# Patient Record
Sex: Female | Born: 1980 | Race: White | Hispanic: No | Marital: Married | Smoking: Never smoker
Health system: Southern US, Community
[De-identification: ages and names within clinical notes are randomized; demographics above are authoritative.]

## PROBLEM LIST (undated history)

## (undated) DIAGNOSIS — F32A Depression, unspecified: Secondary | ICD-10-CM

## (undated) DIAGNOSIS — O99345 Other mental disorders complicating the puerperium: Secondary | ICD-10-CM

## (undated) DIAGNOSIS — F22 Delusional disorders: Secondary | ICD-10-CM

## (undated) DIAGNOSIS — F419 Anxiety disorder, unspecified: Secondary | ICD-10-CM

## (undated) DIAGNOSIS — F53 Postpartum depression: Secondary | ICD-10-CM

## (undated) DIAGNOSIS — K9041 Non-celiac gluten sensitivity: Secondary | ICD-10-CM

## (undated) DIAGNOSIS — Z8619 Personal history of other infectious and parasitic diseases: Secondary | ICD-10-CM

## (undated) DIAGNOSIS — N200 Calculus of kidney: Secondary | ICD-10-CM

## (undated) DIAGNOSIS — M1990 Unspecified osteoarthritis, unspecified site: Secondary | ICD-10-CM

## (undated) DIAGNOSIS — L4050 Arthropathic psoriasis, unspecified: Secondary | ICD-10-CM

## (undated) DIAGNOSIS — D649 Anemia, unspecified: Secondary | ICD-10-CM

## (undated) DIAGNOSIS — F329 Major depressive disorder, single episode, unspecified: Secondary | ICD-10-CM

## (undated) HISTORY — DX: Non-celiac gluten sensitivity: K90.41

## (undated) HISTORY — DX: Personal history of other infectious and parasitic diseases: Z86.19

## (undated) HISTORY — PX: ANTERIOR CRUCIATE LIGAMENT REPAIR: SHX115

## (undated) HISTORY — PX: CERVICAL FUSION: SHX112

## (undated) HISTORY — DX: Calculus of kidney: N20.0

## (undated) HISTORY — DX: Anxiety disorder, unspecified: F41.9

---

## 2003-03-04 ENCOUNTER — Ambulatory Visit (HOSPITAL_COMMUNITY): Admission: AD | Admit: 2003-03-04 | Discharge: 2003-03-05 | Payer: Self-pay | Admitting: Obstetrics & Gynecology

## 2003-03-24 ENCOUNTER — Ambulatory Visit (HOSPITAL_COMMUNITY): Admission: AD | Admit: 2003-03-24 | Discharge: 2003-03-24 | Payer: Self-pay | Admitting: Obstetrics and Gynecology

## 2003-03-25 ENCOUNTER — Ambulatory Visit (HOSPITAL_COMMUNITY): Admission: AD | Admit: 2003-03-25 | Discharge: 2003-03-26 | Payer: Self-pay | Admitting: Obstetrics & Gynecology

## 2003-04-26 ENCOUNTER — Observation Stay (HOSPITAL_COMMUNITY): Admission: AD | Admit: 2003-04-26 | Discharge: 2003-04-27 | Payer: Self-pay | Admitting: Obstetrics & Gynecology

## 2003-05-01 ENCOUNTER — Ambulatory Visit (HOSPITAL_COMMUNITY): Admission: AD | Admit: 2003-05-01 | Discharge: 2003-05-01 | Payer: Self-pay | Admitting: Obstetrics & Gynecology

## 2003-05-17 ENCOUNTER — Ambulatory Visit (HOSPITAL_COMMUNITY): Admission: AD | Admit: 2003-05-17 | Discharge: 2003-05-17 | Payer: Self-pay | Admitting: Obstetrics & Gynecology

## 2003-05-20 ENCOUNTER — Observation Stay (HOSPITAL_COMMUNITY): Admission: RE | Admit: 2003-05-20 | Discharge: 2003-05-20 | Payer: Self-pay | Admitting: Obstetrics and Gynecology

## 2003-05-23 ENCOUNTER — Inpatient Hospital Stay (HOSPITAL_COMMUNITY): Admission: RE | Admit: 2003-05-23 | Discharge: 2003-05-25 | Payer: Self-pay | Admitting: Obstetrics & Gynecology

## 2003-08-21 HISTORY — PX: CYSTOSCOPY: SUR368

## 2004-09-29 ENCOUNTER — Emergency Department (HOSPITAL_COMMUNITY): Admission: EM | Admit: 2004-09-29 | Discharge: 2004-09-29 | Payer: Self-pay | Admitting: Emergency Medicine

## 2006-01-16 ENCOUNTER — Emergency Department (HOSPITAL_COMMUNITY): Admission: EM | Admit: 2006-01-16 | Discharge: 2006-01-16 | Payer: Self-pay | Admitting: Emergency Medicine

## 2006-01-18 ENCOUNTER — Emergency Department (HOSPITAL_COMMUNITY): Admission: EM | Admit: 2006-01-18 | Discharge: 2006-01-18 | Payer: Self-pay | Admitting: Emergency Medicine

## 2006-05-05 ENCOUNTER — Ambulatory Visit (HOSPITAL_COMMUNITY): Admission: AD | Admit: 2006-05-05 | Discharge: 2006-05-05 | Payer: Self-pay | Admitting: Obstetrics and Gynecology

## 2006-07-21 ENCOUNTER — Ambulatory Visit (HOSPITAL_COMMUNITY): Admission: AD | Admit: 2006-07-21 | Discharge: 2006-07-21 | Payer: Self-pay | Admitting: Obstetrics and Gynecology

## 2006-07-29 ENCOUNTER — Inpatient Hospital Stay (HOSPITAL_COMMUNITY): Admission: RE | Admit: 2006-07-29 | Discharge: 2006-07-30 | Payer: Self-pay | Admitting: Obstetrics and Gynecology

## 2009-08-20 HISTORY — PX: LASIK: SHX215

## 2010-11-14 LAB — ABO/RH

## 2010-11-14 LAB — RUBELLA ANTIBODY, IGM: Rubella: IMMUNE

## 2010-11-14 LAB — GC/CHLAMYDIA PROBE AMP, GENITAL
Chlamydia: NEGATIVE
Gonorrhea: NEGATIVE

## 2010-11-14 LAB — HIV ANTIBODY (ROUTINE TESTING W REFLEX): HIV: NONREACTIVE

## 2011-04-04 ENCOUNTER — Emergency Department (HOSPITAL_COMMUNITY)
Admission: EM | Admit: 2011-04-04 | Discharge: 2011-04-05 | Disposition: A | Discharge status: Other Acute Inpatient Hospital | Payer: BC Managed Care – PPO | Source: Home / Self Care | Attending: Emergency Medicine | Admitting: Emergency Medicine

## 2011-04-04 DIAGNOSIS — O99891 Other specified diseases and conditions complicating pregnancy: Secondary | ICD-10-CM | POA: Insufficient documentation

## 2011-04-04 DIAGNOSIS — O26899 Other specified pregnancy related conditions, unspecified trimester: Secondary | ICD-10-CM

## 2011-04-04 DIAGNOSIS — R109 Unspecified abdominal pain: Secondary | ICD-10-CM | POA: Insufficient documentation

## 2011-04-04 HISTORY — DX: Arthropathic psoriasis, unspecified: L40.50

## 2011-04-04 HISTORY — DX: Other mental disorders complicating the puerperium: O99.345

## 2011-04-04 HISTORY — DX: Major depressive disorder, single episode, unspecified: F32.9

## 2011-04-04 HISTORY — DX: Postpartum depression: F53.0

## 2011-04-04 HISTORY — DX: Depression, unspecified: F32.A

## 2011-04-04 NOTE — ED Notes (Signed)
Pt is [redacted] weeks pregnant, has not felt well today, around 6pm had "?contaction/pain " around left side, laid down, and woke having more pain and pressure. 4th pregnancy.  Denies urinary s/s, due date oct. 23, 2012

## 2011-04-04 NOTE — ED Notes (Signed)
EDP in with pt, bimanual pelvic completed without incident, pt on fetal monitor, women's aware of pt

## 2011-04-04 NOTE — ED Provider Notes (Signed)
History     CSN: [id number] Arrival date & time: 04/04/2011 11:04 PM  Chief Complaint  Patient presents with  . Abdominal Cramping   Patient is a 30 y.o. female presenting with cramps. The history is provided by the patient.  Abdominal Cramping The primary symptoms of the illness include abdominal pain. The primary symptoms of the illness do not include fever, nausea, vomiting, vaginal discharge or vaginal bleeding. Primary symptoms comment: Patient is [redacted] weeks pregnant. She feels like she's been having a contraction Episode onset: Symptoms started around 6 PM today seemed to resolve she went to lie down. When she woke up she was having more pain and pressure. The onset of the illness was sudden. The problem has not changed since onset. The patient states that she believes she is currently pregnant (Patient's due date October 23). Symptoms associated with the illness do not include chills, anorexia, heartburn, constipation, urgency or hematuria.    Past Medical History  Diagnosis Date  . Depression   . Psoriatic arthritis   . Post partum depression     Past Surgical History  Procedure Date  . Cervical fusion   . Anterior cruciate ligament repair     No family history on file.  History  Substance Use Topics  . Smoking status: Never Smoker   . Smokeless tobacco: Not on file  . Alcohol Use: No    OB History    Grav Para Term Preterm Abortions TAB SAB Ect Mult Living   1               Review of Systems  Constitutional: Negative for fever and chills.  Gastrointestinal: Positive for abdominal pain. Negative for heartburn, nausea, vomiting, constipation and anorexia.  Genitourinary: Negative for urgency, hematuria, vaginal bleeding and vaginal discharge.  All other systems reviewed and are negative.    Physical Exam  BP 118/59  Pulse 73  Temp(Src) 98 F (36.7 C) (Oral)  Resp 20  Ht 5\' 8"  (1.727 m)  Wt 150 lb (68.04 kg)  BMI 22.81 kg/m2  SpO2 97%  LMP  09/02/2010  Physical Exam  Nursing note and vitals reviewed. Constitutional: She appears well-developed and well-nourished. No distress.  HENT:  Head: Normocephalic and atraumatic.  Right Ear: External ear normal.  Left Ear: External ear normal.  Eyes: Conjunctivae are normal. Right eye exhibits no discharge. Left eye exhibits no discharge. No scleral icterus.  Neck: Neck supple. No tracheal deviation present.  Pulmonary/Chest: Effort normal. No stridor. No respiratory distress.  Abdominal: There is no tenderness. There is no rebound and no guarding.  Genitourinary: Vagina normal. No vaginal discharge found.       Bimanual exam the cervical os is closed  Musculoskeletal: She exhibits no edema.  Neurological: She is alert. Cranial nerve deficit: no gross deficits.  Skin: Skin is warm and dry. No rash noted.  Psychiatric: She has a normal mood and affect.    ED Course  Procedures The patient was placed on fetal monitors. A saline lock was placed.  Spoke with Dr. Perlie Gold. The patient will be transferred down to [facility] in [place] for further evaluation and monitoring to rule out preterm labor. MDM At this point the patient appears medically stable. She does not appear to be in any imminent danger of delivery. We will transfer her to the for further evaluation at [facility]      Celene Kras, MD 04/04/11 2340

## 2011-04-04 NOTE — ED Notes (Signed)
During triage, pt is 4 pregnancy, 3 live births no abortions, no mis.  lmp  ?Sep 02, 2010

## 2011-04-05 ENCOUNTER — Encounter (HOSPITAL_COMMUNITY): Payer: Self-pay | Admitting: *Deleted

## 2011-04-05 ENCOUNTER — Inpatient Hospital Stay (HOSPITAL_COMMUNITY)
Admission: AD | Admit: 2011-04-05 | Discharge: 2011-04-05 | Disposition: A | Discharge status: Home / Self Care | Payer: BC Managed Care – PPO | Source: Other Acute Inpatient Hospital | Attending: Obstetrics and Gynecology | Admitting: Obstetrics and Gynecology

## 2011-04-05 DIAGNOSIS — Z3480 Encounter for supervision of other normal pregnancy, unspecified trimester: Secondary | ICD-10-CM

## 2011-04-05 DIAGNOSIS — O479 False labor, unspecified: Secondary | ICD-10-CM

## 2011-04-05 DIAGNOSIS — Z3490 Encounter for supervision of normal pregnancy, unspecified, unspecified trimester: Secondary | ICD-10-CM

## 2011-04-05 DIAGNOSIS — O4700 False labor before 37 completed weeks of gestation, unspecified trimester: Secondary | ICD-10-CM | POA: Insufficient documentation

## 2011-04-05 HISTORY — DX: Unspecified osteoarthritis, unspecified site: M19.90

## 2011-04-05 LAB — URINALYSIS, ROUTINE W REFLEX MICROSCOPIC
Bilirubin Urine: NEGATIVE
Glucose, UA: NEGATIVE mg/dL
Hgb urine dipstick: NEGATIVE
Ketones, ur: 15 mg/dL — AB
Protein, ur: NEGATIVE mg/dL
Urobilinogen, UA: 0.2 mg/dL (ref 0.0–1.0)

## 2011-04-05 LAB — WET PREP, GENITAL: Clue Cells Wet Prep HPF POC: NONE SEEN

## 2011-04-05 LAB — CBC
HCT: 30.9 % — ABNORMAL LOW (ref 36.0–46.0)
MCH: 32.4 pg (ref 26.0–34.0)
MCV: 96.3 fL (ref 78.0–100.0)
Platelets: 161 10*3/uL (ref 150–400)
RDW: 12.8 % (ref 11.5–15.5)

## 2011-04-05 MED ORDER — ZOLPIDEM TARTRATE 10 MG PO TABS: 10.0000 mg | ORAL_TABLET | Freq: Every evening | ORAL | Status: AC | PRN

## 2011-04-05 NOTE — Progress Notes (Signed)
NOW SAYS FEELS BETTER  THAN SHE WAS AT [facility] . ABD IS LESS TIGHT. LAST UC WAS AT [facility] Kitchen DID NOT FEEL ANY INTENSE PAIN IN  CARE LINK  . SAYS FEELS  BETTER NOW THAN PAST 24 HRS.

## 2011-04-05 NOTE — ED Provider Notes (Signed)
History     No chief complaint on file.  HPI [id number] at [redacted]w[redacted]d with abdominal tightening. States tightening began around 6 PM, constant, lasted until about 9 PM then went to [facility] for eval. Also c/o back pain. No bleeding or LOF. + fetal movement. Pt also states she is having trouble sleeping and would like something to help with sleep, tylenol PM makes her groggy the next day. Uncomplicated prenatal course. At [facility], cervix was closed, no contractions and reactive tracing.   OB History    Grav Para Term Preterm Abortions TAB SAB Ect Mult Living   4 3 3       3       Past Medical History  Diagnosis Date  . Depression   . Psoriatic arthritis   . Post partum depression   . Arthritis     Past Surgical History  Procedure Date  . Cervical fusion   . Anterior cruciate ligament repair     No family history on file.  History  Substance Use Topics  . Smoking status: Never Smoker   . Smokeless tobacco: Not on file  . Alcohol Use: No    Allergies:  Allergies  Allergen Reactions  . Codeine Nausea And Vomiting  . Zofran Other (See Comments)    Felt like she was going to jump out of the bed. Odd sensation    Prescriptions prior to admission  Medication Sig Dispense Refill  . acetaminophen (TYLENOL) 500 MG tablet Take 500 mg by mouth every 6 (six) hours as needed. For pain       . Brompheniramine-Pseudoeph (DIMETAPP PO) Take by mouth daily as needed. For cough       . diphenhydrAMINE (BENADRYL) 12.5 MG/5ML elixir Take 25 mg by mouth 4 (four) times daily as needed. For allergy       . Prenatal Vit-Fe Fumarate-FA (PRENATAL PLUS) 65-1 MG TABS Take 1 tablet by mouth daily.          Review of Systems  Constitutional: Negative.   Respiratory: Negative.   Cardiovascular: Negative.   Gastrointestinal: Positive for abdominal pain. Negative for nausea, vomiting, diarrhea and constipation.  Genitourinary: Negative for dysuria, urgency, frequency, hematuria and flank pain.     Negative for vaginal bleeding, + tightening  Musculoskeletal: Positive for back pain.  Neurological: Negative.   Psychiatric/Behavioral: Negative.     Physical Exam   Blood pressure 103/58, pulse 73, temperature 97.8 F (36.6 C), temperature source Oral, resp. rate 20, last menstrual period 09/02/2010, SpO2 96.00%.  Physical Exam  Nursing note and vitals reviewed. Constitutional: She is oriented to person, place, and time. She appears well-developed and well-nourished. No distress.  HENT:  Head: Normocephalic and atraumatic.  Cardiovascular: Normal rate.   Respiratory: Effort normal.  GI: Soft. Bowel sounds are normal. She exhibits no mass. There is no tenderness. There is no rebound and no guarding.  Genitourinary: There is no rash or lesion on the right labia. There is no rash or lesion on the left labia. Uterus is not tender. Enlarged: Size c/w dates. Cervix exhibits no motion tenderness, no discharge and no friability. Right adnexum displays no mass, no tenderness and no fullness. Left adnexum displays no mass, no tenderness and no fullness. No tenderness or bleeding around the vagina. No vaginal discharge found.       Closed/long/high   Musculoskeletal: Normal range of motion.  Neurological: She is alert and oriented to person, place, and time.  Skin: Skin is warm and dry.  Psychiatric: She has a normal mood and affect.   EFM: Baseline:120s Variability:mod Accels:positive Decels:absent  Toco:quiet   MAU Course  Procedures  Results for orders placed during the hospital encounter of 04/05/11 (from the past 24 hour(s))  URINALYSIS, ROUTINE W REFLEX MICROSCOPIC     Status: Abnormal   Collection Time   04/05/11  2:02 AM      Component Value Range   Color, Urine YELLOW  YELLOW    Appearance CLEAR  CLEAR    Specific Gravity, Urine <1.005 (*) 1.005 - 1.030    pH 7.0  5.0 - 8.0    Glucose, UA NEGATIVE  NEGATIVE (mg/dL)   Hgb urine dipstick NEGATIVE  NEGATIVE     Bilirubin Urine NEGATIVE  NEGATIVE    Ketones, ur 15 (*) NEGATIVE (mg/dL)   Protein, ur NEGATIVE  NEGATIVE (mg/dL)   Urobilinogen, UA 0.2  0.0 - 1.0 (mg/dL)   Nitrite NEGATIVE  NEGATIVE    Leukocytes, UA NEGATIVE  NEGATIVE   CBC     Status: Abnormal   Collection Time   04/05/11  2:15 AM      Component Value Range   WBC 8.6  4.0 - 10.5 (K/uL)   RBC 3.21 (*) 3.87 - 5.11 (MIL/uL)   Hemoglobin 10.4 (*) 12.0 - 15.0 (g/dL)   HCT 40.9 (*) 81.1 - 46.0 (%)   MCV 96.3  78.0 - 100.0 (fL)   MCH 32.4  26.0 - 34.0 (pg)   MCHC 33.7  30.0 - 36.0 (g/dL)   RDW 91.4  78.2 - 95.6 (%)   Platelets 161  150 - 400 (K/uL)  WET PREP, GENITAL     Status: Abnormal   Collection Time   04/05/11  2:34 AM      Component Value Range   Yeast, Wet Prep NONE SEEN  NONE SEEN    Trich, Wet Prep NONE SEEN  NONE SEEN    Clue Cells, Wet Prep NONE SEEN  NONE SEEN    WBC, Wet Prep HPF POC FEW (*) NONE SEEN    Consulted with Dr. Vincente Poli, ok to d/c home with precautions if labs and exam WNL and tracing reactive  Assessment and Plan  30 yo [id number] at [redacted]w[redacted]d Braxton Hicks contractions Rx Ambien to help with sleep Precautions rev'd  F/U as scheduled on Monday with Dr. Rondel Baton 04/05/2011, 3:46 AM

## 2011-04-05 NOTE — Progress Notes (Signed)
PT ASLEEP IN ROOM- WAITING ON RIDE

## 2011-04-05 NOTE — Progress Notes (Signed)
PT ARRIVED VIA EMS  SAYING WENT TO [place]  AT 2300-  PAIN  IN ABD AND LITTLE IN BACK  STARTED AT 6PM. UNCOMFORABLE ALL  DAY-  CALLED DR OFFICE 1038PM- TOLD HER TO GO TO HOSPITAL.

## 2011-06-03 ENCOUNTER — Encounter (HOSPITAL_COMMUNITY): Payer: Self-pay

## 2011-06-03 ENCOUNTER — Inpatient Hospital Stay (HOSPITAL_COMMUNITY)
Admission: AD | Admit: 2011-06-03 | Discharge: 2011-06-06 | DRG: 373 | Disposition: A | Discharge status: Home / Self Care | Payer: BC Managed Care – PPO | Source: Ambulatory Visit | Attending: Obstetrics and Gynecology | Admitting: Obstetrics and Gynecology

## 2011-06-03 ENCOUNTER — Inpatient Hospital Stay (HOSPITAL_COMMUNITY)
Admission: AD | Admit: 2011-06-03 | Discharge: 2011-06-03 | Disposition: A | Discharge status: Home / Self Care | Payer: BC Managed Care – PPO | Source: Ambulatory Visit | Attending: Obstetrics and Gynecology | Admitting: Obstetrics and Gynecology

## 2011-06-03 ENCOUNTER — Encounter (HOSPITAL_COMMUNITY): Payer: Self-pay | Admitting: *Deleted

## 2011-06-03 DIAGNOSIS — O479 False labor, unspecified: Secondary | ICD-10-CM | POA: Insufficient documentation

## 2011-06-03 LAB — CBC
MCH: 32.4 pg (ref 26.0–34.0)
MCV: 94.4 fL (ref 78.0–100.0)
Platelets: 181 10*3/uL (ref 150–400)
RBC: 3.58 MIL/uL — ABNORMAL LOW (ref 3.87–5.11)

## 2011-06-03 MED ORDER — LACTATED RINGERS IV SOLN
500.0000 mL | INTRAVENOUS | Status: DC | PRN
  Administered 2011-06-04: 500 mL via INTRAVENOUS

## 2011-06-03 MED ORDER — OXYTOCIN 20 UNITS IN LACTATED RINGERS INFUSION - SIMPLE: 125.0000 mL/h | Freq: Once | INTRAVENOUS | Status: DC

## 2011-06-03 MED ORDER — OXYTOCIN BOLUS FROM INFUSION
500.0000 mL | Freq: Once | INTRAVENOUS | Status: DC
  Filled 2011-06-03: qty 500
  Filled 2011-06-03 (×2): qty 1000

## 2011-06-03 MED ORDER — FLEET ENEMA 7-19 GM/118ML RE ENEM: 1.0000 | ENEMA | RECTAL | Status: DC | PRN

## 2011-06-03 MED ORDER — ACETAMINOPHEN 325 MG PO TABS: 650.0000 mg | ORAL_TABLET | ORAL | Status: DC | PRN

## 2011-06-03 MED ORDER — LACTATED RINGERS IV SOLN
INTRAVENOUS | Status: DC
  Administered 2011-06-03: 21:00:00 via INTRAVENOUS

## 2011-06-03 MED ORDER — CITRIC ACID-SODIUM CITRATE 334-500 MG/5ML PO SOLN: 30.0000 mL | ORAL | Status: DC | PRN

## 2011-06-03 MED ORDER — LIDOCAINE HCL (PF) 1 % IJ SOLN
30.0000 mL | INTRAMUSCULAR | Status: DC | PRN
  Filled 2011-06-03 (×3): qty 30

## 2011-06-03 MED ORDER — IBUPROFEN 600 MG PO TABS: 600.0000 mg | ORAL_TABLET | Freq: Four times a day (QID) | ORAL | Status: DC | PRN

## 2011-06-03 MED ORDER — ZOLPIDEM TARTRATE 5 MG PO TABS
5.0000 mg | ORAL_TABLET | Freq: Once | ORAL | Status: AC
  Administered 2011-06-03: 5 mg via ORAL
  Filled 2011-06-03: qty 1

## 2011-06-03 NOTE — Progress Notes (Signed)
Dr. Marcelle Overlie called.  Wants to know how much longer it will be before pt can go to birthing suites.  Called birthing suites charge RN.  Pt still not able to come.  Will call when room is available.

## 2011-06-03 NOTE — Progress Notes (Signed)
Pt will go to room 158. Not able to take pt at this time. Will call when pt may come to room.

## 2011-06-03 NOTE — Progress Notes (Signed)
Dr. Marcelle Overlie notified of pt returning from walking. Notified of VE and fetal strip.  Admit orders received.

## 2011-06-03 NOTE — Progress Notes (Signed)
G4P3 at 38.5wks. Ctxs started at 1800. Worse when up and moving but better when sits. Ctxs 3-65mins apart while driving to hosp from [place]. Was 1cm/50 at last sve on Weds

## 2011-06-03 NOTE — Progress Notes (Signed)
Dr Marcelle Overlie notified of tracing, ctx pattern, sve result, including patient history of fast delivery after 4cm. Notified that pateint requests 5mg  Remus Loffler and wants to stay locally because of the long drive home ([place]).

## 2011-06-03 NOTE — Progress Notes (Signed)
Pt returned from walking at this time.  efm and toco placed.  Assessing.

## 2011-06-03 NOTE — H&P (Signed)
Amy Hines is a 30 y.o. female presenting for labor. Maternal Medical History:  Reason for admission: Reason for admission: contractions.  Contractions: Onset was 6-12 hours ago.   Frequency: regular.   Perceived severity is moderate.    Fetal activity: Perceived fetal activity is normal.   Last perceived fetal movement was within the past hour.      OB History    Grav Para Term Preterm Abortions TAB SAB Ect Mult Living   4 3 3  0 0 0 0 0 0 3     Past Medical History  Diagnosis Date  . Depression   . Psoriatic arthritis   . Post partum depression   . Arthritis    Past Surgical History  Procedure Date  . Cervical fusion   . Anterior cruciate ligament repair   . Cystoscopy 2005  . Lasik 2011   Family History: family history is not on file. Social History:  reports that she has never smoked. She does not have any smokeless tobacco history on file. She reports that she does not drink alcohol or use illicit drugs.  ROS  Dilation: 3.5 Effacement (%): 80 Station: -1 Exam by:: Humphrey Rolls, RN Blood pressure 120/76, pulse 87, temperature 97.3 F (36.3 C), temperature source Oral, resp. rate 18, height 5\' 8"  (1.727 m), last menstrual period 09/02/2010. Maternal Exam:  Uterine Assessment: Contraction strength is moderate.  Contraction frequency is regular.   Abdomen: Patient reports no abdominal tenderness. Fundal height is term FH.   Fetal presentation: vertex     Physical Exam  Constitutional: She appears well-developed and well-nourished.  HENT:  Head: Normocephalic.  Neck: Normal range of motion. Neck supple.  Cardiovascular: Normal rate and regular rhythm.   Respiratory: Effort normal and breath sounds normal. N Genitourinary:       3-4 cm/vtx/BOW I    Prenatal labs: ABO, Rh: A/Positive/-- (03/27 0000) Antibody: Negative (03/27 0000) Rubella: Immune (03/27 0000) RPR: Nonreactive (03/27 0000)  HBsAg: Negative (03/27 0000)  HIV: Non-reactive (03/27 0000)    GBS:   NEG  Assessment/Plan: Term preg, labor.  Hx rapid labor, hx PP depression noted.   Meriel Pica 06/03/2011, 9:26 PM

## 2011-06-03 NOTE — Progress Notes (Signed)
Order received to discharge patient home after dose of ambien 5mg  po or okay with patient walking and staying around the hospital for 4hours and recheck cervix

## 2011-06-03 NOTE — Progress Notes (Signed)
Pt up to ambulate at this time.

## 2011-06-03 NOTE — Progress Notes (Signed)
Pt reports pressure and UC's

## 2011-06-04 ENCOUNTER — Encounter (HOSPITAL_COMMUNITY): Payer: Self-pay | Admitting: Anesthesiology

## 2011-06-04 ENCOUNTER — Inpatient Hospital Stay (HOSPITAL_COMMUNITY): Payer: BC Managed Care – PPO | Admitting: Anesthesiology

## 2011-06-04 ENCOUNTER — Encounter (HOSPITAL_COMMUNITY): Payer: Self-pay | Admitting: *Deleted

## 2011-06-04 LAB — TYPE AND SCREEN

## 2011-06-04 LAB — ABO/RH: ABO/RH(D): A POS

## 2011-06-04 MED ORDER — PHENYLEPHRINE 40 MCG/ML (10ML) SYRINGE FOR IV PUSH (FOR BLOOD PRESSURE SUPPORT)
80.0000 ug | PREFILLED_SYRINGE | INTRAVENOUS | Status: DC | PRN
  Filled 2011-06-04 (×2): qty 5

## 2011-06-04 MED ORDER — DIPHENHYDRAMINE HCL 25 MG PO CAPS: 25.0000 mg | ORAL_CAPSULE | Freq: Four times a day (QID) | ORAL | Status: DC | PRN

## 2011-06-04 MED ORDER — LACTATED RINGERS IV SOLN
500.0000 mL | Freq: Once | INTRAVENOUS | Status: AC
  Administered 2011-06-04: 500 mL via INTRAVENOUS

## 2011-06-04 MED ORDER — SIMETHICONE 80 MG PO CHEW: 80.0000 mg | CHEWABLE_TABLET | ORAL | Status: DC | PRN

## 2011-06-04 MED ORDER — DIPHENHYDRAMINE HCL 50 MG/ML IJ SOLN: 12.5000 mg | INTRAMUSCULAR | Status: DC | PRN

## 2011-06-04 MED ORDER — BISACODYL 10 MG RE SUPP: 10.0000 mg | Freq: Every day | RECTAL | Status: DC | PRN

## 2011-06-04 MED ORDER — IBUPROFEN 600 MG PO TABS
600.0000 mg | ORAL_TABLET | Freq: Four times a day (QID) | ORAL | Status: DC
  Administered 2011-06-04 – 2011-06-06 (×6): 600 mg via ORAL
  Filled 2011-06-04 (×6): qty 1

## 2011-06-04 MED ORDER — HYDROXYZINE HCL 50 MG/ML IM SOLN
50.0000 mg | Freq: Once | INTRAMUSCULAR | Status: AC
  Administered 2011-06-04: 50 mg via INTRAMUSCULAR
  Filled 2011-06-04: qty 1

## 2011-06-04 MED ORDER — EPHEDRINE 5 MG/ML INJ
10.0000 mg | INTRAVENOUS | Status: DC | PRN
  Filled 2011-06-04 (×2): qty 4

## 2011-06-04 MED ORDER — OXYCODONE-ACETAMINOPHEN 5-325 MG PO TABS
1.0000 | ORAL_TABLET | ORAL | Status: DC | PRN
  Administered 2011-06-05 – 2011-06-06 (×3): 1 via ORAL
  Filled 2011-06-04 (×3): qty 1

## 2011-06-04 MED ORDER — ONDANSETRON HCL 4 MG PO TABS: 4.0000 mg | ORAL_TABLET | ORAL | Status: DC | PRN

## 2011-06-04 MED ORDER — DIBUCAINE 1 % RE OINT: 1.0000 "application " | TOPICAL_OINTMENT | RECTAL | Status: DC | PRN

## 2011-06-04 MED ORDER — FLEET ENEMA 7-19 GM/118ML RE ENEM: 1.0000 | ENEMA | RECTAL | Status: DC | PRN

## 2011-06-04 MED ORDER — ONDANSETRON HCL 4 MG/2ML IJ SOLN: 4.0000 mg | INTRAMUSCULAR | Status: DC | PRN

## 2011-06-04 MED ORDER — BENZOCAINE-MENTHOL 20-0.5 % EX AERO: 1.0000 "application " | INHALATION_SPRAY | CUTANEOUS | Status: DC | PRN

## 2011-06-04 MED ORDER — TETANUS-DIPHTH-ACELL PERTUSSIS 5-2.5-18.5 LF-MCG/0.5 IM SUSP: 0.5000 mL | Freq: Once | INTRAMUSCULAR | Status: DC

## 2011-06-04 MED ORDER — LANOLIN HYDROUS EX OINT: TOPICAL_OINTMENT | CUTANEOUS | Status: DC | PRN

## 2011-06-04 MED ORDER — PROMETHAZINE HCL 25 MG/ML IJ SOLN
12.5000 mg | Freq: Once | INTRAMUSCULAR | Status: AC
  Administered 2011-06-04: 12.5 mg via INTRAVENOUS
  Filled 2011-06-04: qty 1

## 2011-06-04 MED ORDER — ZOLPIDEM TARTRATE 5 MG PO TABS: 5.0000 mg | ORAL_TABLET | Freq: Every evening | ORAL | Status: DC | PRN

## 2011-06-04 MED ORDER — SALINE SPRAY 0.65 % NA SOLN
1.0000 | NASAL | Status: DC | PRN
Start: 2011-06-04 — End: 2011-06-04
  Administered 2011-06-04: 1 via NASAL
  Filled 2011-06-04: qty 44

## 2011-06-04 MED ORDER — LIDOCAINE HCL 1.5 % IJ SOLN
INTRAMUSCULAR | Status: DC | PRN
  Administered 2011-06-04 (×2): 5 mL via INTRADERMAL

## 2011-06-04 MED ORDER — OXYTOCIN 10 UNIT/ML IJ SOLN
INTRAMUSCULAR | Status: AC
  Filled 2011-06-04: qty 2

## 2011-06-04 MED ORDER — SENNOSIDES-DOCUSATE SODIUM 8.6-50 MG PO TABS
2.0000 | ORAL_TABLET | Freq: Every day | ORAL | Status: DC
  Administered 2011-06-04 – 2011-06-05 (×2): 2 via ORAL

## 2011-06-04 MED ORDER — NALBUPHINE SYRINGE 5 MG/0.5 ML
10.0000 mg | INJECTION | Freq: Once | INTRAMUSCULAR | Status: AC
  Administered 2011-06-04: 10 mg via INTRAVENOUS
  Filled 2011-06-04: qty 1

## 2011-06-04 MED ORDER — PHENYLEPHRINE 40 MCG/ML (10ML) SYRINGE FOR IV PUSH (FOR BLOOD PRESSURE SUPPORT)
80.0000 ug | PREFILLED_SYRINGE | INTRAVENOUS | Status: DC | PRN
  Filled 2011-06-04: qty 5

## 2011-06-04 MED ORDER — METHYLERGONOVINE MALEATE 0.2 MG/ML IJ SOLN
INTRAMUSCULAR | Status: AC
  Filled 2011-06-04: qty 1

## 2011-06-04 MED ORDER — MISOPROSTOL 200 MCG PO TABS
ORAL_TABLET | ORAL | Status: AC
  Filled 2011-06-04: qty 5

## 2011-06-04 MED ORDER — FENTANYL 2.5 MCG/ML BUPIVACAINE 1/10 % EPIDURAL INFUSION (WH - ANES)
14.0000 mL/h | INTRAMUSCULAR | Status: DC
  Administered 2011-06-04 (×2): 14 mL/h via EPIDURAL
  Filled 2011-06-04 (×2): qty 60

## 2011-06-04 MED ORDER — WITCH HAZEL-GLYCERIN EX PADS
1.0000 "application " | MEDICATED_PAD | CUTANEOUS | Status: DC | PRN
  Administered 2011-06-05: 1 via TOPICAL

## 2011-06-04 MED ORDER — PRENATAL PLUS 27-1 MG PO TABS
1.0000 | ORAL_TABLET | Freq: Every day | ORAL | Status: DC
  Administered 2011-06-04 – 2011-06-06 (×3): 1 via ORAL
  Filled 2011-06-04 (×3): qty 1

## 2011-06-04 MED ORDER — EPHEDRINE 5 MG/ML INJ
10.0000 mg | INTRAVENOUS | Status: DC | PRN
  Filled 2011-06-04: qty 4

## 2011-06-04 NOTE — Progress Notes (Signed)
Pt returned from walking in the halls.  Monitors reapplied

## 2011-06-04 NOTE — Progress Notes (Signed)
FHT reactive Cx 5-6/C/-1 AROM  Clear UCs about q 4-6 min

## 2011-06-04 NOTE — Progress Notes (Signed)
Delivery Rapid second stage SVD VMI  Apgars 7/8 Art pH pending, wt pending Blow by O2 Placenta 3 vessels, intact EBL 400cc Ut involuting well, firm Second degree midline ant lac repaired Pt infant stable in LDR

## 2011-06-04 NOTE — Anesthesia Preprocedure Evaluation (Signed)
Anesthesia Evaluation  Name, MR# and DOB Patient awake  General Assessment Comment  Reviewed: Allergy & Precautions, H&P , NPO status , Patient's Chart, lab work & pertinent test results, reviewed documented beta blocker date and time   History of Anesthesia Complications Negative for: history of anesthetic complications  Airway Mallampati: I TM Distance: >3 FB Neck ROM: full    Dental  (+) Teeth Intact   Pulmonary  clear to auscultation        Cardiovascular regular Normal    Neuro/Psych PSYCHIATRIC DISORDERS (depression/PPdepresison) Negative Neurological ROS     GI/Hepatic negative GI ROS Neg liver ROS    Endo/Other  Negative Endocrine ROS  Renal/GU negative Renal ROS  Genitourinary negative   Musculoskeletal  (+) Arthritis - (psoriatic arthritis),   Abdominal   Peds  Hematology negative hematology ROS (+)   Anesthesia Other Findings   Reproductive/Obstetrics (+) Pregnancy (PP hemorrhage after all three prior deliveries - requiring only pharmaceutical correction)                           Anesthesia Physical Anesthesia Plan  ASA: II  Anesthesia Plan: Epidural   Post-op Pain Management:    Induction:   Airway Management Planned:   Additional Equipment:   Intra-op Plan:   Post-operative Plan:   Informed Consent: I have reviewed the patients History and Physical, chart, labs and discussed the procedure including the risks, benefits and alternatives for the proposed anesthesia with the patient or authorized representative who has indicated his/her understanding and acceptance.     Plan Discussed with:   Anesthesia Plan Comments:         Anesthesia Quick Evaluation

## 2011-06-04 NOTE — Anesthesia Procedure Notes (Signed)
Epidural Patient location during procedure: OB Start time: 06/04/2011 10:15 AM Reason for block: procedure for pain  Staffing Performed by: anesthesiologist   Preanesthetic Checklist Completed: patient identified, site marked, surgical consent, pre-op evaluation, timeout performed, IV checked, risks and benefits discussed and monitors and equipment checked  Epidural Patient position: sitting Prep: site prepped and draped and DuraPrep Patient monitoring: continuous pulse ox and blood pressure Approach: midline Injection technique: LOR air  Needle:  Needle type: Tuohy  Needle gauge: 17 G Needle length: 9 cm Needle insertion depth: 5 cm cm Catheter type: closed end flexible Catheter size: 19 Gauge Catheter at skin depth: 10 cm Test dose: negative  Assessment Events: blood not aspirated, injection not painful, no injection resistance, negative IV test and no paresthesia  Additional Notes Discussed risk of headache, infection, bleeding, nerve injury and failed or incomplete block.  Patient voices understanding and wishes to proceed.

## 2011-06-04 NOTE — Progress Notes (Signed)
Given update on patient. Patient requesting IV pain medication. Orders received.

## 2011-06-04 NOTE — Progress Notes (Signed)
Pt using the birthing ball and walking in the room.  Monitor off after 30 min tracing.  Dr Marcelle Overlie aware of all parameters.

## 2011-06-04 NOTE — Progress Notes (Signed)
FHT reactive UCs about q 4 min Cx 4-5/C/-2/vtx  D/W pt options of management, ie AROM vs expectant management. Also , D/W options of pain relief meds, IV vs epidural vs none.  Hx of rapid labors after AROM reviewed, hx of PP hemorrhage.  Pt considering options.  Epidural prn. Will do T&S.  Saline lock in place, said to be functioning well.

## 2011-06-05 LAB — CBC
Hemoglobin: 10.7 g/dL — ABNORMAL LOW (ref 12.0–15.0)
MCH: 32.5 pg (ref 26.0–34.0)
RBC: 3.29 MIL/uL — ABNORMAL LOW (ref 3.87–5.11)

## 2011-06-05 MED ORDER — HYDROCORTISONE ACE-PRAMOXINE 1-1 % RE FOAM
1.0000 | Freq: Two times a day (BID) | RECTAL | Status: DC
  Administered 2011-06-05 – 2011-06-06 (×3): 1 via RECTAL
  Filled 2011-06-05: qty 10

## 2011-06-05 MED ORDER — BENZOCAINE-MENTHOL 20-0.5 % EX AERO
INHALATION_SPRAY | CUTANEOUS | Status: AC
  Filled 2011-06-05: qty 56

## 2011-06-05 NOTE — Anesthesia Postprocedure Evaluation (Signed)
  Anesthesia Post-op Note  Patient: Amy Hines  Procedure(s) Performed: * No procedures listed *  Patient Location: Mother/Baby  Anesthesia Type: Epidural  Level of Consciousness: awake, alert  and oriented  Airway and Oxygen Therapy: Patient Spontanous Breathing  Post-op Pain: mild  Post-op Assessment: Patient's Cardiovascular Status Stable, Respiratory Function Stable, Patent Airway, No signs of Nausea or vomiting, Adequate PO intake, Pain level controlled and No headache  Post-op Vital Signs: stable  Complications: No apparent anesthesia complications

## 2011-06-05 NOTE — Progress Notes (Signed)
Diet order changed by RN to gluten free so pt will only have gluten free options to choose from when ordering. Patient services manager notified by RD to address issues with ordering meals.

## 2011-06-05 NOTE — Progress Notes (Signed)
Post Partum Day 1 Subjective: no complaints, up ad lib and voiding  Objective: Blood pressure 96/60, pulse 59, temperature 97.5 F (36.4 C), temperature source Oral, resp. rate 18, height 5\' 8"  (1.727 m), weight 70.761 kg (156 lb), last menstrual period 09/02/2010, SpO2 100.00%, unknown if currently breastfeeding.  Physical Exam:  General: alert and cooperative Lochia: appropriate Uterine Fundus: firm Incision: healing well, small hemorrhoids DVT Evaluation: No evidence of DVT seen on physical exam.   Basename 06/05/11 0530 06/03/11 2122  HGB 10.7* 11.6*  HCT 31.5* 33.8*    Assessment/Plan: Plan for discharge tomorrow   LOS: 2 days   CURTIS,CAROL G 06/05/2011, 8:31 AM

## 2011-06-06 MED ORDER — OXYCODONE-ACETAMINOPHEN 5-325 MG PO TABS: 1.0000 | ORAL_TABLET | ORAL | Status: AC | PRN

## 2011-06-06 MED ORDER — IBUPROFEN 600 MG PO TABS: 600.0000 mg | ORAL_TABLET | Freq: Four times a day (QID) | ORAL | Status: AC

## 2011-06-06 NOTE — Progress Notes (Signed)
Post Partum Day 2 Subjective: no complaints, up ad lib and voiding  Objective: Blood pressure 94/59, pulse 60, temperature 98.2 F (36.8 C), temperature source Oral, resp. rate 18, height 5\' 8"  (1.727 m), weight 70.761 kg (156 lb), last menstrual period 09/02/2010, SpO2 100.00%, unknown if currently breastfeeding.  Physical Exam:  General: alert and cooperative Lochia: appropriate Uterine Fundus: firm Incision: healing well DVT Evaluation: No evidence of DVT seen on physical exam.   Basename 06/05/11 0530 06/03/11 2122  HGB 10.7* 11.6*  HCT 31.5* 33.8*    Assessment/Plan: Discharge home   LOS: 3 days   Aarit Kashuba G 06/06/2011, 8:01 AM

## 2011-06-06 NOTE — Discharge Summary (Signed)
Obstetric Discharge Summary Reason for Admission: onset of labor Prenatal Procedures: none Intrapartum Procedures: spontaneous vaginal delivery Postpartum Procedures: none Complications-Operative and Postpartum: mle lac with repair Hemoglobin  Date Value Range Status  06/05/2011 10.7* 12.0-15.0 (Hines/dL) Final     HCT  Date Value Range Status  06/05/2011 31.5* 36.0-46.0 (%) Final    Discharge Diagnoses: Term Pregnancy-delivered  Discharge Information: Date: 06/06/2011 Activity: pelvic rest Diet: routine Medications: PNV, Ibuprofen and Percocet Condition: stable Instructions: refer to practice specific booklet Discharge to: home   Newborn Data: Live born female  Birth Weight: 8 lb 5.2 oz (3776 Hines) APGAR: 7, 7  Home with mother.  Amy Hines 06/06/2011, 8:11 AM

## 2012-09-09 LAB — OB RESULTS CONSOLE GC/CHLAMYDIA: Chlamydia: NEGATIVE

## 2012-09-09 LAB — OB RESULTS CONSOLE HEPATITIS B SURFACE ANTIGEN: Hepatitis B Surface Ag: NEGATIVE

## 2012-09-09 LAB — OB RESULTS CONSOLE RPR: RPR: NONREACTIVE

## 2012-09-09 LAB — OB RESULTS CONSOLE HIV ANTIBODY (ROUTINE TESTING): HIV: NONREACTIVE

## 2013-03-24 ENCOUNTER — Inpatient Hospital Stay (HOSPITAL_COMMUNITY)
Admission: AD | Admit: 2013-03-24 | Discharge: 2013-03-24 | Disposition: A | Discharge status: Home / Self Care | Payer: BC Managed Care – PPO | Source: Ambulatory Visit | Attending: Obstetrics and Gynecology | Admitting: Obstetrics and Gynecology

## 2013-03-24 ENCOUNTER — Telehealth (HOSPITAL_COMMUNITY): Payer: Self-pay | Admitting: *Deleted

## 2013-03-24 ENCOUNTER — Encounter (HOSPITAL_COMMUNITY): Payer: Self-pay | Admitting: *Deleted

## 2013-03-24 ENCOUNTER — Encounter (HOSPITAL_COMMUNITY): Payer: Self-pay

## 2013-03-24 DIAGNOSIS — O479 False labor, unspecified: Secondary | ICD-10-CM | POA: Insufficient documentation

## 2013-03-24 HISTORY — DX: Anemia, unspecified: D64.9

## 2013-03-24 HISTORY — DX: Delusional disorders: F22

## 2013-03-24 MED ORDER — ZOLPIDEM TARTRATE 5 MG PO TABS
5.0000 mg | ORAL_TABLET | Freq: Once | ORAL | Status: AC
  Administered 2013-03-24: 5 mg via ORAL
  Filled 2013-03-24: qty 1

## 2013-03-24 NOTE — Telephone Encounter (Signed)
Preadmission screen  

## 2013-03-24 NOTE — MAU Note (Signed)
Patient is in with c/o ctx q2-30mins since 3am. She states that she was 1cm last week Thursday. She denies vaginal bleeding or lof. She reports good fetal movement.

## 2013-03-27 ENCOUNTER — Inpatient Hospital Stay (HOSPITAL_COMMUNITY): Payer: BC Managed Care – PPO | Admitting: Anesthesiology

## 2013-03-27 ENCOUNTER — Encounter (HOSPITAL_COMMUNITY): Payer: Self-pay | Admitting: Anesthesiology

## 2013-03-27 ENCOUNTER — Inpatient Hospital Stay (HOSPITAL_COMMUNITY)
Admission: AD | Admit: 2013-03-27 | Discharge: 2013-03-29 | DRG: 373 | Disposition: A | Discharge status: Home / Self Care | Payer: BC Managed Care – PPO | Source: Ambulatory Visit | Attending: Obstetrics and Gynecology | Admitting: Obstetrics and Gynecology

## 2013-03-27 ENCOUNTER — Encounter (HOSPITAL_COMMUNITY): Payer: Self-pay | Admitting: *Deleted

## 2013-03-27 LAB — CBC
HCT: 33 % — ABNORMAL LOW (ref 36.0–46.0)
MCH: 31.9 pg (ref 26.0–34.0)
MCV: 91.4 fL (ref 78.0–100.0)
RDW: 13.9 % (ref 11.5–15.5)
WBC: 12.2 10*3/uL — ABNORMAL HIGH (ref 4.0–10.5)

## 2013-03-27 MED ORDER — LACTATED RINGERS IV SOLN: 500.0000 mL | INTRAVENOUS | Status: DC | PRN

## 2013-03-27 MED ORDER — FENTANYL 2.5 MCG/ML BUPIVACAINE 1/10 % EPIDURAL INFUSION (WH - ANES)
INTRAMUSCULAR | Status: DC | PRN
  Administered 2013-03-27: 14 mL/h via EPIDURAL

## 2013-03-27 MED ORDER — IBUPROFEN 600 MG PO TABS: 600.0000 mg | ORAL_TABLET | Freq: Four times a day (QID) | ORAL | Status: DC | PRN

## 2013-03-27 MED ORDER — PHENYLEPHRINE 40 MCG/ML (10ML) SYRINGE FOR IV PUSH (FOR BLOOD PRESSURE SUPPORT)
80.0000 ug | PREFILLED_SYRINGE | INTRAVENOUS | Status: DC | PRN
  Filled 2013-03-27: qty 2
  Filled 2013-03-27: qty 5

## 2013-03-27 MED ORDER — LACTATED RINGERS IV SOLN
500.0000 mL | INTRAVENOUS | Status: DC | PRN
  Administered 2013-03-27 (×2): 500 mL via INTRAVENOUS

## 2013-03-27 MED ORDER — FLEET ENEMA 7-19 GM/118ML RE ENEM: 1.0000 | ENEMA | RECTAL | Status: DC | PRN

## 2013-03-27 MED ORDER — OXYCODONE-ACETAMINOPHEN 5-325 MG PO TABS: 1.0000 | ORAL_TABLET | ORAL | Status: DC | PRN

## 2013-03-27 MED ORDER — BUTORPHANOL TARTRATE 1 MG/ML IJ SOLN: 1.0000 mg | INTRAMUSCULAR | Status: DC | PRN

## 2013-03-27 MED ORDER — LACTATED RINGERS IV SOLN
INTRAVENOUS | Status: DC
  Administered 2013-03-27 (×2): via INTRAVENOUS

## 2013-03-27 MED ORDER — PROMETHAZINE HCL 25 MG/ML IJ SOLN
12.5000 mg | Freq: Once | INTRAMUSCULAR | Status: AC
  Administered 2013-03-27: 12.5 mg via INTRAVENOUS
  Filled 2013-03-27: qty 1

## 2013-03-27 MED ORDER — LACTATED RINGERS IV SOLN: 500.0000 mL | Freq: Once | INTRAVENOUS | Status: DC

## 2013-03-27 MED ORDER — FENTANYL 2.5 MCG/ML BUPIVACAINE 1/10 % EPIDURAL INFUSION (WH - ANES)
14.0000 mL/h | INTRAMUSCULAR | Status: DC | PRN
  Filled 2013-03-27: qty 125

## 2013-03-27 MED ORDER — OXYTOCIN 40 UNITS IN LACTATED RINGERS INFUSION - SIMPLE MED
62.5000 mL/h | INTRAVENOUS | Status: DC
  Filled 2013-03-27: qty 1000

## 2013-03-27 MED ORDER — LIDOCAINE HCL (PF) 1 % IJ SOLN
INTRAMUSCULAR | Status: DC | PRN
  Administered 2013-03-27 (×2): 4 mL

## 2013-03-27 MED ORDER — ACETAMINOPHEN 325 MG PO TABS: 650.0000 mg | ORAL_TABLET | ORAL | Status: DC | PRN

## 2013-03-27 MED ORDER — CITRIC ACID-SODIUM CITRATE 334-500 MG/5ML PO SOLN
30.0000 mL | ORAL | Status: DC | PRN
  Filled 2013-03-27: qty 15

## 2013-03-27 MED ORDER — LIDOCAINE HCL (PF) 1 % IJ SOLN
30.0000 mL | INTRAMUSCULAR | Status: DC | PRN
  Filled 2013-03-27 (×2): qty 30

## 2013-03-27 MED ORDER — EPHEDRINE 5 MG/ML INJ
10.0000 mg | INTRAVENOUS | Status: DC | PRN
  Filled 2013-03-27: qty 2
  Filled 2013-03-27: qty 4

## 2013-03-27 MED ORDER — OXYTOCIN BOLUS FROM INFUSION: 500.0000 mL | INTRAVENOUS | Status: DC

## 2013-03-27 MED ORDER — LIDOCAINE HCL (PF) 1 % IJ SOLN: 30.0000 mL | INTRAMUSCULAR | Status: DC | PRN

## 2013-03-27 MED ORDER — LACTATED RINGERS IV SOLN: INTRAVENOUS | Status: DC

## 2013-03-27 MED ORDER — PHENYLEPHRINE 40 MCG/ML (10ML) SYRINGE FOR IV PUSH (FOR BLOOD PRESSURE SUPPORT)
80.0000 ug | PREFILLED_SYRINGE | INTRAVENOUS | Status: DC | PRN
  Filled 2013-03-27: qty 2

## 2013-03-27 MED ORDER — NALBUPHINE SYRINGE 5 MG/0.5 ML
5.0000 mg | INJECTION | Freq: Once | INTRAMUSCULAR | Status: AC
  Administered 2013-03-27: 5 mg via INTRAVENOUS
  Filled 2013-03-27: qty 0.5

## 2013-03-27 MED ORDER — CITRIC ACID-SODIUM CITRATE 334-500 MG/5ML PO SOLN: 30.0000 mL | ORAL | Status: DC | PRN

## 2013-03-27 MED ORDER — OXYTOCIN 40 UNITS IN LACTATED RINGERS INFUSION - SIMPLE MED: 62.5000 mL/h | INTRAVENOUS | Status: DC

## 2013-03-27 MED ORDER — EPHEDRINE 5 MG/ML INJ
10.0000 mg | INTRAVENOUS | Status: DC | PRN
  Administered 2013-03-27: 10 mg via INTRAVENOUS
  Filled 2013-03-27: qty 2

## 2013-03-27 MED ORDER — DIPHENHYDRAMINE HCL 50 MG/ML IJ SOLN: 12.5000 mg | INTRAMUSCULAR | Status: DC | PRN

## 2013-03-27 NOTE — Anesthesia Procedure Notes (Signed)
Epidural Patient location during procedure: OB Start time: 03/27/2013 11:04 PM  Staffing Anesthesiologist: Glendoris Nodarse A. Performed by: anesthesiologist   Preanesthetic Checklist Completed: patient identified, site marked, surgical consent, pre-op evaluation, timeout performed, IV checked, risks and benefits discussed and monitors and equipment checked  Epidural Patient position: sitting Prep: site prepped and draped and DuraPrep Patient monitoring: continuous pulse ox and blood pressure Approach: midline Injection technique: LOR air  Needle:  Needle type: Tuohy  Needle gauge: 17 G Needle length: 9 cm and 9 Needle insertion depth: 5 cm cm Catheter type: closed end flexible Catheter size: 19 Gauge Catheter at skin depth: 10 cm Test dose: negative and Other  Assessment Events: blood not aspirated, injection not painful, no injection resistance, negative IV test and no paresthesia  Additional Notes Patient identified. Risks and benefits discussed including failed block, incomplete  Pain control, post dural puncture headache, nerve damage, paralysis, blood pressure Changes, nausea, vomiting, reactions to medications-both toxic and allergic and post Partum back pain. All questions were answered. Patient expressed understanding and wished to proceed. Sterile technique was used throughout procedure. Epidural site was Dressed with sterile barrier dressing. No paresthesias, signs of intravascular injection Or signs of intrathecal spread were encountered.  Patient was more comfortable after the epidural was dosed. Please see RN's note for documentation of vital signs and FHR which are stable.

## 2013-03-27 NOTE — MAU Note (Signed)
Pt reports having ctx  Since 3:30 denies SROM or bleeding at this time. Good fetal movement reported.

## 2013-03-27 NOTE — Anesthesia Preprocedure Evaluation (Signed)
Anesthesia Evaluation  Patient identified by MRN, date of birth, ID band  Reviewed: Allergy & Precautions, H&P , Patient's Chart, lab work & pertinent test results  Airway Mallampati: III TM Distance: >3 FB Neck ROM: Full    Dental no notable dental hx. (+) Teeth Intact   Pulmonary neg pulmonary ROS,  breath sounds clear to auscultation  Pulmonary exam normal       Cardiovascular negative cardio ROS  Rhythm:Regular Rate:Normal     Neuro/Psych PSYCHIATRIC DISORDERS Anxiety Depression Paranoianegative neurological ROS     GI/Hepatic Non-Celiac Gluten sensitivity   Endo/Other  negative endocrine ROS  Renal/GU Renal diseaseHx/o Renal Calculi  negative genitourinary   Musculoskeletal  (+) Arthritis -,   Abdominal   Peds  Hematology negative hematology ROS (+)   Anesthesia Other Findings   Reproductive/Obstetrics (+) Pregnancy Multiparity Term Pregnancy                           Anesthesia Physical Anesthesia Plan  ASA: II  Anesthesia Plan: Epidural   Post-op Pain Management:    Induction:   Airway Management Planned: Natural Airway  Additional Equipment:   Intra-op Plan:   Post-operative Plan:   Informed Consent: I have reviewed the patients History and Physical, chart, labs and discussed the procedure including the risks, benefits and alternatives for the proposed anesthesia with the patient or authorized representative who has indicated his/her understanding and acceptance.   Dental advisory given  Plan Discussed with: Anesthesiologist  Anesthesia Plan Comments:         Anesthesia Quick Evaluation

## 2013-03-28 LAB — CBC
Hemoglobin: 10.5 g/dL — ABNORMAL LOW (ref 12.0–15.0)
MCH: 31.5 pg (ref 26.0–34.0)
RBC: 3.33 MIL/uL — ABNORMAL LOW (ref 3.87–5.11)
WBC: 13.7 10*3/uL — ABNORMAL HIGH (ref 4.0–10.5)

## 2013-03-28 MED ORDER — OXYCODONE-ACETAMINOPHEN 5-325 MG PO TABS
1.0000 | ORAL_TABLET | ORAL | Status: DC | PRN
  Administered 2013-03-28 – 2013-03-29 (×4): 1 via ORAL
  Filled 2013-03-28 (×4): qty 1

## 2013-03-28 MED ORDER — IBUPROFEN 600 MG PO TABS
600.0000 mg | ORAL_TABLET | Freq: Four times a day (QID) | ORAL | Status: DC
  Administered 2013-03-28 – 2013-03-29 (×6): 600 mg via ORAL
  Filled 2013-03-28 (×5): qty 1

## 2013-03-28 MED ORDER — OXYTOCIN 40 UNITS IN LACTATED RINGERS INFUSION - SIMPLE MED: 62.5000 mL/h | INTRAVENOUS | Status: AC

## 2013-03-28 MED ORDER — SIMETHICONE 80 MG PO CHEW
80.0000 mg | CHEWABLE_TABLET | Freq: Three times a day (TID) | ORAL | Status: DC
  Administered 2013-03-28 – 2013-03-29 (×5): 80 mg via ORAL

## 2013-03-28 MED ORDER — LANOLIN HYDROUS EX OINT: 1.0000 "application " | TOPICAL_OINTMENT | CUTANEOUS | Status: DC | PRN

## 2013-03-28 MED ORDER — METHYLERGONOVINE MALEATE 0.2 MG PO TABS: 0.2000 mg | ORAL_TABLET | ORAL | Status: DC | PRN

## 2013-03-28 MED ORDER — MENTHOL 3 MG MT LOZG: 1.0000 | LOZENGE | OROMUCOSAL | Status: DC | PRN

## 2013-03-28 MED ORDER — METHYLERGONOVINE MALEATE 0.2 MG/ML IJ SOLN: 0.2000 mg | INTRAMUSCULAR | Status: DC | PRN

## 2013-03-28 MED ORDER — DIBUCAINE 1 % RE OINT: 1.0000 "application " | TOPICAL_OINTMENT | RECTAL | Status: DC | PRN

## 2013-03-28 MED ORDER — MEDROXYPROGESTERONE ACETATE 150 MG/ML IM SUSP: 150.0000 mg | INTRAMUSCULAR | Status: DC | PRN

## 2013-03-28 MED ORDER — SENNOSIDES-DOCUSATE SODIUM 8.6-50 MG PO TABS
2.0000 | ORAL_TABLET | Freq: Every day | ORAL | Status: DC
  Administered 2013-03-28: 2 via ORAL

## 2013-03-28 MED ORDER — MEASLES, MUMPS & RUBELLA VAC ~~LOC~~ INJ
0.5000 mL | INJECTION | Freq: Once | SUBCUTANEOUS | Status: DC
  Filled 2013-03-28: qty 0.5

## 2013-03-28 MED ORDER — WITCH HAZEL-GLYCERIN EX PADS: 1.0000 "application " | MEDICATED_PAD | CUTANEOUS | Status: DC | PRN

## 2013-03-28 MED ORDER — ESCITALOPRAM OXALATE 20 MG PO TABS
20.0000 mg | ORAL_TABLET | Freq: Every day | ORAL | Status: DC
  Administered 2013-03-28 – 2013-03-29 (×2): 20 mg via ORAL
  Filled 2013-03-28 (×2): qty 1

## 2013-03-28 MED ORDER — PRENATAL MULTIVITAMIN CH
1.0000 | ORAL_TABLET | Freq: Every day | ORAL | Status: DC
  Administered 2013-03-28 – 2013-03-29 (×2): 1 via ORAL
  Filled 2013-03-28 (×2): qty 1

## 2013-03-28 MED ORDER — DEXTROSE IN LACTATED RINGERS 5 % IV SOLN: INTRAVENOUS | Status: DC

## 2013-03-28 MED ORDER — METHYLERGONOVINE MALEATE 0.2 MG/ML IJ SOLN
INTRAMUSCULAR | Status: AC
  Filled 2013-03-28: qty 1

## 2013-03-28 MED ORDER — SIMETHICONE 80 MG PO CHEW: 80.0000 mg | CHEWABLE_TABLET | ORAL | Status: DC | PRN

## 2013-03-28 MED ORDER — DIPHENHYDRAMINE HCL 25 MG PO CAPS: 25.0000 mg | ORAL_CAPSULE | Freq: Four times a day (QID) | ORAL | Status: DC | PRN

## 2013-03-28 MED ORDER — TETANUS-DIPHTH-ACELL PERTUSSIS 5-2.5-18.5 LF-MCG/0.5 IM SUSP: 0.5000 mL | Freq: Once | INTRAMUSCULAR | Status: DC

## 2013-03-28 NOTE — Anesthesia Postprocedure Evaluation (Signed)
  Anesthesia Post-op Note  Patient: Amy Hines  Procedure(s) Performed: * No procedures listed *  Patient Location: PACU and Mother/Baby  Anesthesia Type:Epidural  Level of Consciousness: awake, alert  and oriented  Airway and Oxygen Therapy: Patient Spontanous Breathing  Post-op Pain: mild  Post-op Assessment: Patient's Cardiovascular Status Stable, Respiratory Function Stable, No signs of Nausea or vomiting, Adequate PO intake, Pain level controlled, No headache, No backache, No residual numbness and No residual motor weakness  Post-op Vital Signs: stable  Complications: No apparent anesthesia complications

## 2013-03-28 NOTE — Progress Notes (Signed)
SVD of vigerous female infant w/ apgars of 8,9.  Placenta delivered spontaneous w/ 3VC.   Left periurethral degree lac repaired w/ 3-0 vicryl rapide.  Fundus firm.  EBL 300cc .

## 2013-03-28 NOTE — Progress Notes (Signed)
Pt feeling increased pressure, has epidural  FHT reassuring w/ accels Toco Q1-2 Cvx 9/C/0  A/P:  Exp mngt

## 2013-03-28 NOTE — H&P (Signed)
Elonna ELLASON SEGAR is a 32 y.o. female presenting for labor.  Contractions regular and painful.  No lof or vb. History OB History   Grav Para Term Preterm Abortions TAB SAB Ect Mult Living   5 4 4  0 0 0 0 0 0 4     Past Medical History  Diagnosis Date  . Psoriatic arthritis   . Arthritis   . Paranoia   . Anemia   . Hx of varicella   . Depression     major depressive D/O  . Post partum depression     with all deliveries  . Non-celiac gluten sensitivity   . Kidney stone   . Anxiety    Past Surgical History  Procedure Laterality Date  . Cervical fusion    . Anterior cruciate ligament repair    . Cystoscopy  2005  . Lasik  2011   Family History: family history includes Angelman syndrome in her brother and Hypertension in her father. Social History:  reports that she has never smoked. She has never used smokeless tobacco. She reports that she does not drink alcohol or use illicit drugs.   Prenatal Transfer Tool  Maternal Diabetes: No Genetic Screening: Declined Maternal Ultrasounds/Referrals: Normal Fetal Ultrasounds or other Referrals:  None Maternal Substance Abuse:  No Significant Maternal Medications:  Meds include: Other: lexapro Significant Maternal Lab Results:  None Other Comments:  None  ROS  Dilation: 8.5 Effacement (%): 100 Station: 0 Exam by:: GPayne, RN Blood pressure 105/57, pulse 79, resp. rate 18, height 5\' 8"  (1.727 m), weight 79.742 kg (175 lb 12.8 oz), SpO2 100.00%. Exam Physical Exam  Gen - uncomfortable w/ ctx.  Now comfortable w/ epidural Abd - gravid, NT Ext - no edema cvx - 5cm on admit, now 8cm AROM - clear Prenatal labs: ABO, Rh: A/Positive/-- (01/21 0000) Antibody: Negative (01/21 0000) Rubella: Immune (01/21 0000) RPR: Nonreactive (01/21 0000)  HBsAg: Negative (01/21 0000)  HIV: Non-reactive (01/21 0000)  GBS: Negative (07/24 0000)   Assessment/Plan: Admit Exp mngt    Abbas Beyene 03/28/2013, 12:32 AM

## 2013-03-29 ENCOUNTER — Inpatient Hospital Stay (HOSPITAL_COMMUNITY): Admit: 2013-03-29 | Payer: BC Managed Care – PPO

## 2013-03-29 MED ORDER — OXYCODONE-ACETAMINOPHEN 5-325 MG PO TABS: 1.0000 | ORAL_TABLET | ORAL | Status: DC | PRN

## 2013-03-29 MED ORDER — IBUPROFEN 600 MG PO TABS: 600.0000 mg | ORAL_TABLET | Freq: Four times a day (QID) | ORAL | Status: DC

## 2013-03-29 NOTE — Discharge Summary (Signed)
Obstetric Discharge Summary Reason for Admission: onset of labor Prenatal Procedures: none Intrapartum Procedures: spontaneous vaginal delivery Postpartum Procedures: none Complications-Operative and Postpartum: none Hemoglobin  Date Value Range Status  03/28/2013 10.5* 12.0 - 15.0 g/dL Final     HCT  Date Value Range Status  03/28/2013 31.0* 36.0 - 46.0 % Final    Physical Exam:  General: alert and cooperative Lochia: appropriate Uterine Fundus: firm Incision: n/a DVT Evaluation: No evidence of DVT seen on physical exam.  Discharge Diagnoses: Term Pregnancy-delivered  Discharge Information: Date: 03/29/2013 Activity: pelvic rest Diet: routine Medications: PNV, Ibuprofen and Percocet Condition: stable Instructions: refer to practice specific booklet Discharge to: home Follow-up Information   Schedule an appointment as soon as possible for a visit in 6 weeks to follow up.      Newborn Data: Live born female  Birth Weight: 8 lb 13.4 oz (4009 g) APGAR: 8, 9  Home with mother.  Carden Teel 03/29/2013, 8:48 AM

## 2013-03-30 ENCOUNTER — Encounter (HOSPITAL_COMMUNITY): Payer: Self-pay | Admitting: *Deleted

## 2014-06-21 ENCOUNTER — Encounter (HOSPITAL_COMMUNITY): Payer: Self-pay | Admitting: *Deleted

## 2015-08-13 ENCOUNTER — Encounter (INDEPENDENT_AMBULATORY_CARE_PROVIDER_SITE_OTHER): Payer: Self-pay | Admitting: Family Medicine

## 2015-08-13 ENCOUNTER — Ambulatory Visit (INDEPENDENT_AMBULATORY_CARE_PROVIDER_SITE_OTHER): Payer: Medicaid Other | Admitting: Family Medicine

## 2015-08-13 VITALS — BP 123/72 | HR 78 | Temp 98.3°F | Resp 14 | Ht 68.0 in | Wt 141.0 lb

## 2015-08-13 DIAGNOSIS — B9789 Other viral agents as the cause of diseases classified elsewhere: Secondary | ICD-10-CM

## 2015-08-13 DIAGNOSIS — J069 Acute upper respiratory infection, unspecified: Secondary | ICD-10-CM

## 2015-08-13 NOTE — Patient Instructions (Signed)
Viral Infection    Antibiotics not indicated  Please take either 2 extra strength tylenol every 8 hrs or 2 aleve in the AM and 2 in the PM with food    If symptoms not improving, fevers, etc please follow-up

## 2015-08-13 NOTE — Progress Notes (Signed)
[facility]  PROGRESS NOTE     Patient: Kristi Clayton   Date: 08/13/2015   MRN: [id number]   All 3 kids sick and here c pt  pt gen healthy   Current Outpatient Prescriptions   Medication Sig Dispense Refill   . DULoxetine (CYMBALTA) 60 MG capsule TAKE ONE CAPSULE EVERY NITE AS DIRECTED  1   . methylphenidate (RITALIN) 5 MG tablet TAKE 2 TABLETS EVERY MORNING AS DIRECTED  0   . zolpidem (AMBIEN) 10 MG tablet TAKE 1 TABLET BY MOUTH AT BEDTIME AS NEEDED FOR SLEEP  0     No current facility-administered medications for this visit.   no tob  Husband does security  3 kids all here c URIs for 3-5 days  Pts just started      Kristi Clayton is a 34 y.o. female      SUBJECTIVE     Chief Complaint   Patient presents with   . Sore Throat     Per mom she started having a sore throat. Pt denies fever          HPI    Review of Systems    The following portions of the patient's history were reviewed and updated as appropriate: Allergies, Current Medications, Past Family History, Past Medical history, Past social history, Past surgical history, and Problem List.    OBJECTIVE     Vitals   Filed Vitals:    08/13/15 1204   BP: 123/72   Pulse: 78   Temp: 98.3 F (36.8 C)   TempSrc: Tympanic   Resp: 14   Height: 1.727 m (5\' 8" )   Weight: 63.957 kg (141 lb)       Physical Exam   Nursing note and vitals reviewed.  Constitutional: She is oriented to person, place, and time. She appears well-developed and well-nourished. No distress.   HENT:   Head: Normocephalic and atraumatic.   Right Ear: Tympanic membrane and external ear normal.   Left Ear: Tympanic membrane and external ear normal.   Mouth/Throat: Oropharynx is clear and moist. No oropharyngeal exudate.   Eyes: Conjunctivae are normal. Right conjunctiva is not injected. Left conjunctiva is not injected. No scleral icterus.   Neck: Normal range of motion. Neck supple.   Cardiovascular: Normal rate, regular rhythm and normal heart sounds.    No murmur heard.  Pulmonary/Chest: Effort  normal and breath sounds normal. No respiratory distress. She has no wheezes.   Lymphadenopathy:     She has no cervical adenopathy.   Neurological: She is alert and oriented to person, place, and time.   Skin: Skin is warm and dry. No rash noted.   Psychiatric: She has a normal mood and affect.       Lab Results (24 Hour)   Results     ** No results found for the last 24 hours. **          Radiology Results (24 Hour)     ** No results found for the last 24 hours. **          ASSESSMENT     No diagnosis found.  Viral uri     PLAN     Procedures  See avs   There are no diagnoses linked to this encounter.    An After Visit Summary was printed and given to the patient.      Signed,  Patsi Sears, MD  08/13/2015

## 2017-05-10 ENCOUNTER — Emergency Department (HOSPITAL_COMMUNITY)
Admission: EM | Admit: 2017-05-10 | Discharge: 2017-05-11 | Disposition: A | Discharge status: Home / Self Care | Payer: PRIVATE HEALTH INSURANCE | Attending: Emergency Medicine | Admitting: Emergency Medicine

## 2017-05-10 ENCOUNTER — Encounter (HOSPITAL_COMMUNITY): Payer: Self-pay | Admitting: Emergency Medicine

## 2017-05-10 ENCOUNTER — Emergency Department (HOSPITAL_COMMUNITY): Payer: PRIVATE HEALTH INSURANCE

## 2017-05-10 DIAGNOSIS — Y999 Unspecified external cause status: Secondary | ICD-10-CM | POA: Diagnosis not present

## 2017-05-10 DIAGNOSIS — Z79899 Other long term (current) drug therapy: Secondary | ICD-10-CM | POA: Diagnosis not present

## 2017-05-10 DIAGNOSIS — S199XXA Unspecified injury of neck, initial encounter: Secondary | ICD-10-CM | POA: Diagnosis present

## 2017-05-10 DIAGNOSIS — Y92410 Unspecified street and highway as the place of occurrence of the external cause: Secondary | ICD-10-CM | POA: Insufficient documentation

## 2017-05-10 DIAGNOSIS — R51 Headache: Secondary | ICD-10-CM | POA: Insufficient documentation

## 2017-05-10 DIAGNOSIS — Y9389 Activity, other specified: Secondary | ICD-10-CM | POA: Insufficient documentation

## 2017-05-10 DIAGNOSIS — M25511 Pain in right shoulder: Secondary | ICD-10-CM | POA: Insufficient documentation

## 2017-05-10 DIAGNOSIS — S161XXA Strain of muscle, fascia and tendon at neck level, initial encounter: Secondary | ICD-10-CM | POA: Insufficient documentation

## 2017-05-10 DIAGNOSIS — R0789 Other chest pain: Secondary | ICD-10-CM | POA: Insufficient documentation

## 2017-05-10 LAB — POC URINE PREG, ED: PREG TEST UR: NEGATIVE

## 2017-05-10 MED ORDER — CYCLOBENZAPRINE HCL 10 MG PO TABS
10.0000 mg | ORAL_TABLET | Freq: Once | ORAL | Status: AC
  Administered 2017-05-11: 10 mg via ORAL
  Filled 2017-05-10: qty 1
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
## 2017-05-10 NOTE — ED Triage Notes (Signed)
In [facility] with daughter who is also pt

## 2017-05-11 ENCOUNTER — Encounter: Payer: Self-pay | Admitting: Emergency Medicine

## 2017-05-11 MED ORDER — CYCLOBENZAPRINE HCL 10 MG PO TABS: 10.0000 mg | ORAL_TABLET | Freq: Three times a day (TID) | ORAL | 0 refills | Status: DC | PRN

## 2017-05-11 MED ORDER — TRAMADOL HCL 50 MG PO TABS: 50.0000 mg | ORAL_TABLET | Freq: Four times a day (QID) | ORAL | 0 refills | Status: AC | PRN

## 2017-05-11 MED ORDER — TRAMADOL HCL 50 MG PO TABS
50.0000 mg | ORAL_TABLET | Freq: Once | ORAL | Status: AC
Start: 2017-05-11 — End: 2017-05-11
  Administered 2017-05-11: 50 mg via ORAL
  Filled 2017-05-11: qty 1

## 2017-05-11 NOTE — Discharge Instructions (Signed)
Apply ice packs on and off to your neck.  Follow-up with your primary provider for recheck.  Return here for any worsening symptoms
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
## 2019-02-27 ENCOUNTER — Encounter (HOSPITAL_COMMUNITY): Payer: Self-pay

## 2019-02-27 ENCOUNTER — Emergency Department (HOSPITAL_COMMUNITY): Payer: Medicaid - Out of State

## 2019-02-27 ENCOUNTER — Other Ambulatory Visit: Payer: Self-pay

## 2019-02-27 ENCOUNTER — Emergency Department (HOSPITAL_COMMUNITY)
Admission: EM | Admit: 2019-02-27 | Discharge: 2019-02-27 | Disposition: A | Discharge status: Home / Self Care | Payer: Medicaid - Out of State | Attending: Emergency Medicine | Admitting: Emergency Medicine

## 2019-02-27 DIAGNOSIS — R079 Chest pain, unspecified: Secondary | ICD-10-CM | POA: Diagnosis present

## 2019-02-27 DIAGNOSIS — Z79899 Other long term (current) drug therapy: Secondary | ICD-10-CM | POA: Diagnosis not present

## 2019-02-27 DIAGNOSIS — R0789 Other chest pain: Secondary | ICD-10-CM | POA: Insufficient documentation

## 2019-02-27 DIAGNOSIS — R0602 Shortness of breath: Secondary | ICD-10-CM

## 2019-02-27 LAB — BASIC METABOLIC PANEL
Anion gap: 6 (ref 5–15)
BUN: 11 mg/dL (ref 6–20)
CO2: 26 mmol/L (ref 22–32)
Calcium: 8.9 mg/dL (ref 8.9–10.3)
Chloride: 105 mmol/L (ref 98–111)
Creatinine, Ser: 0.75 mg/dL (ref 0.44–1.00)
GFR calc Af Amer: >60 mL/min (ref >60)
GFR calc non Af Amer: >60 mL/min (ref >60)
Glucose, Bld: 94 mg/dL (ref 70–99)
Potassium: 4.2 mmol/L (ref 3.5–5.1)
Sodium: 137 mmol/L (ref 135–145)

## 2019-02-27 LAB — CBC
HCT: 34.1 % — ABNORMAL LOW (ref 36.0–46.0)
Hemoglobin: 10.3 g/dL — ABNORMAL LOW (ref 12.0–15.0)
MCH: 26.4 pg (ref 26.0–34.0)
MCHC: 30.2 g/dL (ref 30.0–36.0)
MCV: 87.4 fL (ref 80.0–100.0)
Platelets: 245 10*3/uL (ref 150–400)
RBC: 3.9 MIL/uL (ref 3.87–5.11)
RDW: 14.5 % (ref 11.5–15.5)
WBC: 6.4 10*3/uL (ref 4.0–10.5)
nRBC: 0 % (ref 0.0–0.2)

## 2019-02-27 LAB — TROPONIN I (HIGH SENSITIVITY)
Troponin I (High Sensitivity): <2 ng/L (ref <18)
Troponin I (High Sensitivity): <2 ng/L (ref <18)

## 2019-02-27 LAB — D-DIMER, QUANTITATIVE (NOT AT ARMC): D-Dimer, Quant: 0.29 ug/mL-FEU (ref 0.00–0.50)

## 2019-02-27 MED ORDER — CYCLOBENZAPRINE HCL 5 MG PO TABS: 5.0000 mg | ORAL_TABLET | Freq: Three times a day (TID) | ORAL | 0 refills | Status: AC | PRN

## 2019-02-27 MED ORDER — IBUPROFEN 400 MG PO TABS
600.0000 mg | ORAL_TABLET | Freq: Once | ORAL | Status: DC
  Filled 2019-02-27: qty 2

## 2019-02-27 MED ORDER — PREDNISONE 20 MG PO TABS: 20.0000 mg | ORAL_TABLET | Freq: Every day | ORAL | 0 refills | Status: AC

## 2019-02-27 MED ORDER — PREDNISONE 50 MG PO TABS
60.0000 mg | ORAL_TABLET | Freq: Once | ORAL | Status: AC
  Administered 2019-02-27: 60 mg via ORAL
  Filled 2019-02-27: qty 1

## 2019-02-27 NOTE — ED Triage Notes (Signed)
Pt states she came here after waiting 8 hour at [facility], had blood work and ekg and never received results.  Pt having chest pains for several days, tightness and squeezing with some sob at times.

## 2019-02-27 NOTE — Discharge Instructions (Addendum)
Use ice and heat on your chest for comfort.  Take the medications as prescribed for your chest wall pain.  Your tests today were all normal, I suspect your chest pain is from stress and anxiety.  The medication is a anti-inflammatory and muscle relaxer to help the chest wall muscles.  Please let your primary care doctor know about your ED visit.

## 2019-02-27 NOTE — ED Provider Notes (Signed)
[facility] EMERGENCY DEPARTMENT Provider Note   CSN: [id number] Arrival date & time: 02/27/19  0117  Time seen 3:35 AM  History   Chief Complaint Chief Complaint  Patient presents with  . Chest Pain    HPI Amy Hines is a 38 y.o. female.     HPI patient states about 5 days ago while on vacation she started having central chest discomfort that she describes as "someone sitting on it" and a tightness and squeezing sensation.  It does not radiate.  She states the pain is been there constantly since the afternoon on July 9.  She states laying down and breathing fast makes it hurt more, taking slow deep breaths makes it feel better.  She has some shortness of breath.  She states sometimes she feels like her heart is beating strongly and it makes her feel lightheaded.  Sometimes it skips and beats too fast.  Please note during my exam her heart rate 65.  She denies nausea or vomiting but states she gets episodes where she feels clammy.  She states [facility] and was there 8 hours and tests were normal.  She states she had a heart murmur as a child.  She states her mother has some type of heart valve disease.  She states she has not had diabetes, hypertension, or high cholesterol.  She does relate they drove 6 hours on June 29 to go to [place] for vacation and drove back on June 6.  She also then relates she has had the same discomfort off and on over the past year that she gets with stress but normally it does not last this long. Patient states her mother gave her a aspirin 325 mg around 3:45 PM.  She states she was told several years ago that she may have fibromyalgia.  PCP Dr Lenard ForthShiplett in [place], [place] [facility] PA [place] in [place], [place]  Past Medical History:  Diagnosis Date  . Anemia   . Anxiety   . Arthritis   . Depression    major depressive D/O  . Hx of varicella   . Kidney stone   . Non-celiac gluten sensitivity   . Paranoia (HCC)   . Post partum depression    with  all deliveries  . Psoriatic arthritis (HCC)     There are no active problems to display for this patient.   Past Surgical History:  Procedure Laterality Date  . ANTERIOR CRUCIATE LIGAMENT REPAIR    . CERVICAL FUSION    . CYSTOSCOPY  2005  . LASIK  2011     OB History    Gravida  5   Para  5   Term  5   Preterm  0   AB  0   Living  5     SAB  0   TAB  0   Ectopic  0   Multiple  0   Live Births  5            Home Medications    Prior to Admission medications   Medication Sig Start Date End Date Taking? Authorizing Provider  cyclobenzaprine (FLEXERIL) 5 MG tablet Take 1 tablet (5 mg total) by mouth 3 (three) times daily as needed. 02/27/19   Devoria AlbeKnapp, Kaleiyah Polsky, MD  FLUoxetine (PROZAC) 40 MG capsule Take 40 mg by mouth at bedtime.    [provider]  lamoTRIgine (LAMICTAL) 200 MG tablet Take 200 mg by mouth at bedtime.    [provider]  predniSONE (DELTASONE) 20 MG tablet Take 1 tablet (20 mg total) by mouth daily. Take 2 po QD x 3d then 1 po QD x 3d with food. 02/27/19   Rolland Porter, MD  QUEtiapine (SEROQUEL) 25 MG tablet Take 25 mg by mouth at bedtime.    [provider]  traMADol (ULTRAM) 50 MG tablet Take 1 tablet (50 mg total) by mouth every 6 (six) hours as needed. 05/11/17   Kem Parkinson, PA-C    Family History Family History  Problem Relation Age of Onset  . Hypertension Father   . Angelman syndrome Brother        not diagnosed but close diagnosis as possible    Social History Social History   Tobacco Use  . Smoking status: Never Smoker  . Smokeless tobacco: Never Used  Substance Use Topics  . Alcohol use: No  . Drug use: No  Lives with spouse   Allergies   Codeine, Gluten, Ibuprofen, and Zofran   Review of Systems Review of Systems  All other systems reviewed and are negative.    Physical Exam Updated Vital Signs BP (!) 110/59   Pulse 70   Temp 98.7 F (37.1 C) (Oral)   Resp 19   Ht 5\' 8"  (1.727 m)    Wt 71.7 kg   LMP 02/10/2019 (Approximate)   SpO2 100%   BMI 24.02 kg/m   Vital signs normal    Physical Exam Vitals signs and nursing note reviewed.  Constitutional:      General: She is not in acute distress.    Appearance: Normal appearance. She is well-developed. She is not ill-appearing or toxic-appearing.  HENT:     Head: Normocephalic and atraumatic.     Right Ear: External ear normal.     Left Ear: External ear normal.     Nose: Nose normal. No mucosal edema or rhinorrhea.     Mouth/Throat:     Mouth: Mucous membranes are moist.     Dentition: No dental abscesses.     Pharynx: Oropharynx is clear. No uvula swelling.  Eyes:     Extraocular Movements: Extraocular movements intact.     Conjunctiva/sclera: Conjunctivae normal.     Pupils: Pupils are equal, round, and reactive to light.  Neck:     Musculoskeletal: Full passive range of motion without pain, normal range of motion and neck supple.  Cardiovascular:     Rate and Rhythm: Normal rate and regular rhythm.     Heart sounds: Normal heart sounds. No murmur. No friction rub. No gallop.   Pulmonary:     Effort: Pulmonary effort is normal. No respiratory distress.     Breath sounds: Normal breath sounds. No wheezing, rhonchi or rales.  Chest:     Chest wall: Tenderness present. No crepitus.       Comments: Tender diffusely in her upper chest Abdominal:     General: Bowel sounds are normal. There is no distension.     Palpations: Abdomen is soft.     Tenderness: There is no abdominal tenderness. There is no guarding or rebound.  Musculoskeletal: Normal range of motion.        General: No tenderness.     Comments: Moves all extremities well.   Skin:    General: Skin is warm and dry.     Coloration: Skin is not pale.     Findings: No erythema or rash.  Neurological:     Mental Status: She is alert and oriented  to person, place, and time.     Cranial Nerves: No cranial nerve deficit.  Psychiatric:        Mood  and Affect: Mood is not anxious.        Speech: Speech normal.        Behavior: Behavior normal.      ED Treatments / Results  Labs (all labs ordered are listed, but only abnormal results are displayed) Results for orders placed or performed during the hospital encounter of 02/27/19  Basic metabolic panel  Result Value Ref Range   Sodium 137 135 - 145 mmol/L   Potassium 4.2 3.5 - 5.1 mmol/L   Chloride 105 98 - 111 mmol/L   CO2 26 22 - 32 mmol/L   Glucose, Bld 94 70 - 99 mg/dL   BUN 11 6 - 20 mg/dL   Creatinine, Ser 1.610.75 0.44 - 1.00 mg/dL   Calcium 8.9 8.9 - 09.610.3 mg/dL   GFR calc non Af Amer >60 >60 mL/min   GFR calc Af Amer >60 >60 mL/min   Anion gap 6 5 - 15  CBC  Result Value Ref Range   WBC 6.4 4.0 - 10.5 K/uL   RBC 3.90 3.87 - 5.11 MIL/uL   Hemoglobin 10.3 (L) 12.0 - 15.0 g/dL   HCT 04.534.1 (L) 40.936.0 - 81.146.0 %   MCV 87.4 80.0 - 100.0 fL   MCH 26.4 26.0 - 34.0 pg   MCHC 30.2 30.0 - 36.0 g/dL   RDW 91.414.5 78.211.5 - 95.615.5 %   Platelets 245 150 - 400 K/uL   nRBC 0.0 0.0 - 0.2 %  D-dimer, quantitative  Result Value Ref Range   D-Dimer, Quant 0.29 0.00 - 0.50 ug/mL-FEU  Troponin I (High Sensitivity)  Result Value Ref Range   Troponin I (High Sensitivity) <2.0 <18 ng/L  Troponin I (High Sensitivity)  Result Value Ref Range   Troponin I (High Sensitivity) <2.0 <18 ng/L   Laboratory interpretation all normal except stable anemia    EKG EKG Interpretation  Date/Time:  Friday February 27 2019 01:38:59 EDT Ventricular Rate:  59 PR Interval:    QRS Duration: 103 QT Interval:  426 QTC Calculation: 422 R Axis:   83 Text Interpretation:  Sinus rhythm RSR' in V1 or V2, probably normal variant No old tracing to compare Confirmed by Devoria AlbeKnapp, Sheilia Reznick ([id number]) on 02/27/2019 1:57:48 AM   Radiology Dg Chest Port 1 View  Result Date: 02/27/2019 CLINICAL DATA:  Chest pain for several days. EXAM: PORTABLE CHEST 1 VIEW COMPARISON:  05/11/2017 FINDINGS: The heart size and mediastinal contours are  within normal limits. Both lungs are clear. The visualized skeletal structures are unremarkable. IMPRESSION: No active disease. Electronically Signed   By: Katherine Mantlehristopher  Green M.D.   On: 02/27/2019 02:35    Procedures Procedures (including critical care time)  Medications Ordered in ED Medications  ibuprofen (ADVIL) tablet 600 mg (600 mg Oral Not Given 02/27/19 0407)  predniSONE (DELTASONE) tablet 60 mg (60 mg Oral Given 02/27/19 0418)     Initial Impression / Assessment and Plan / ED Course  I have reviewed the triage vital signs and the nursing notes.  Pertinent labs & imaging results that were available during my care of the patient were reviewed by me and considered in my medical decision making (see chart for details).    I talked to the patient about taking Motrin she did not mention anything until the nurse went in the room and then she states she cannot take  it.  She was given prednisone orally.  I suspect she is having chest wall pain, she has a history of anxiety.  Her pain appears to be musculoskeletal.  Patient states the pain is been constant since at least 3:00 in the afternoon so at least 12 hours.  Her troponin is negative, her d-dimer is negative, concern was with recent long driving that she could be at risk for PE.  Patient was discharged home.  Final Clinical Impressions(s) / ED Diagnoses   Final diagnoses:  Chest wall pain    ED Discharge Orders         Ordered    predniSONE (DELTASONE) 20 MG tablet  Daily     02/27/19 0456    cyclobenzaprine (FLEXERIL) 5 MG tablet  3 times daily PRN     02/27/19 0456         Plan discharge  Devoria AlbeIva Saveah Bahar, MD, Concha PyoFACEP    Kengo Sturges, MD 02/27/19 [phone]

## 2020-01-15 IMAGING — CR PORTABLE CHEST - 1 VIEW
2 series · 4 of 4 positions shown · non-contrast
Comparison: 05/11/2017

CLINICAL DATA: Chest pain for several days.

EXAM:
PORTABLE CHEST 1 VIEW

[Series 1: portable · 0.17mm/px · 2 of 2 slices shown (1 of 2)]
[im 1/2]
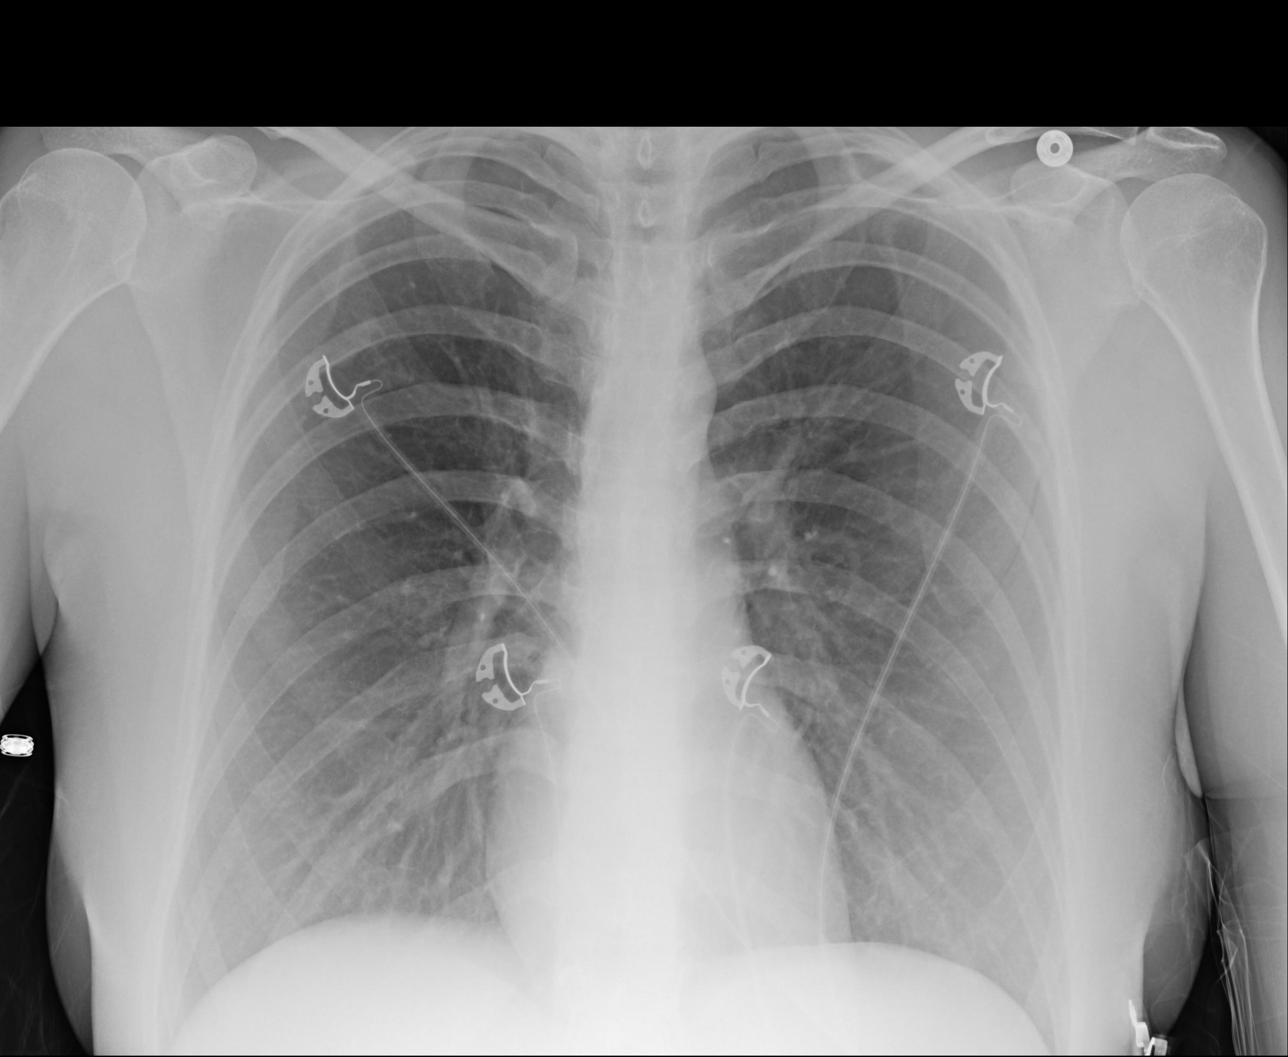
[im 2/2]
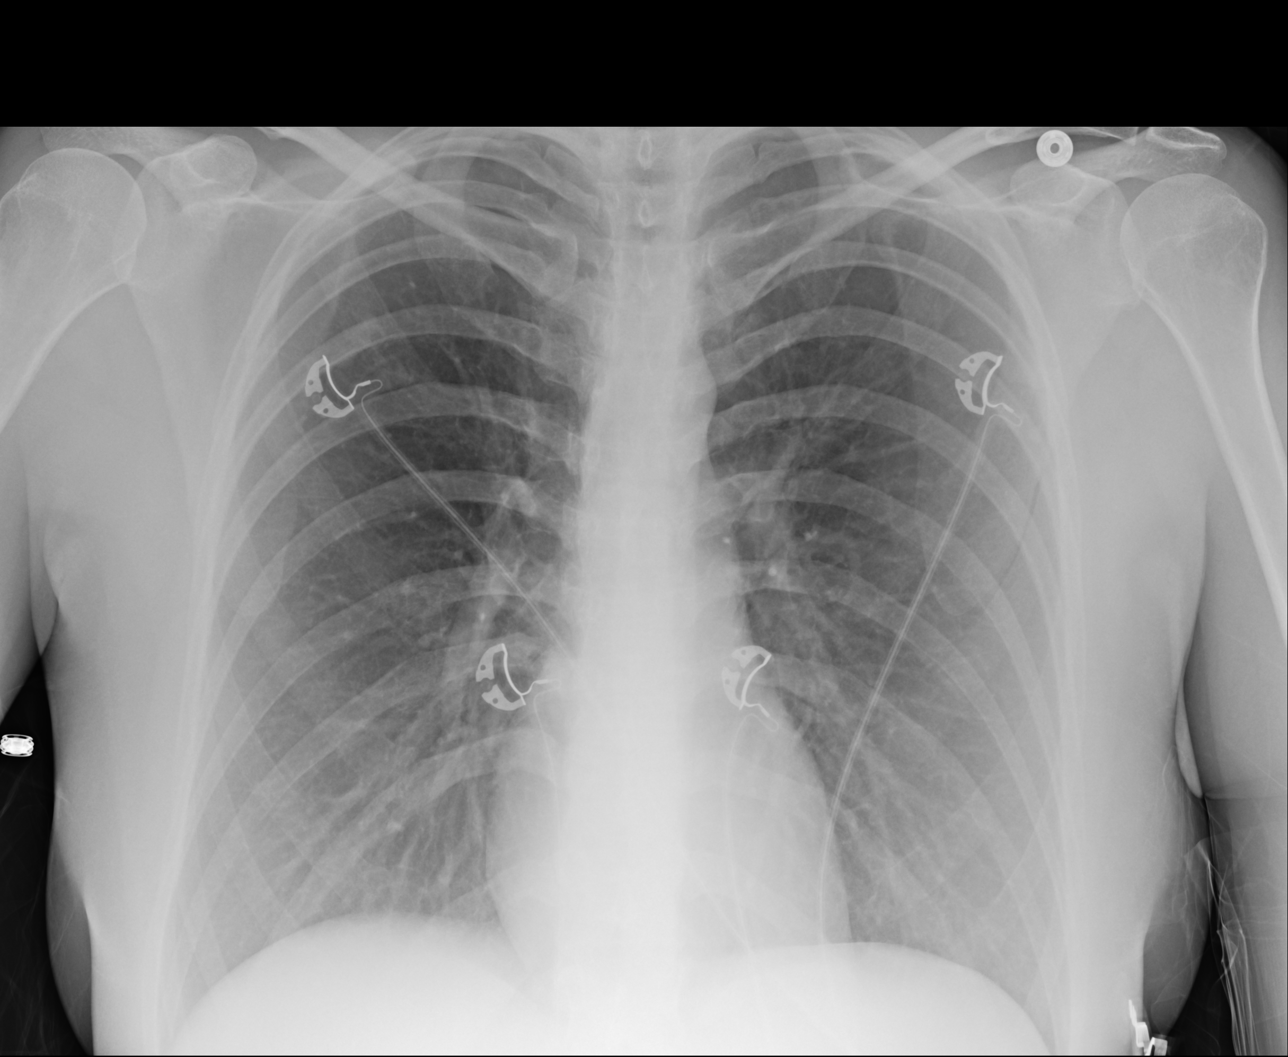

[Series 2: portable · 0.17mm/px · 2 of 2 slices shown (2 of 2)]
[im 1/2]
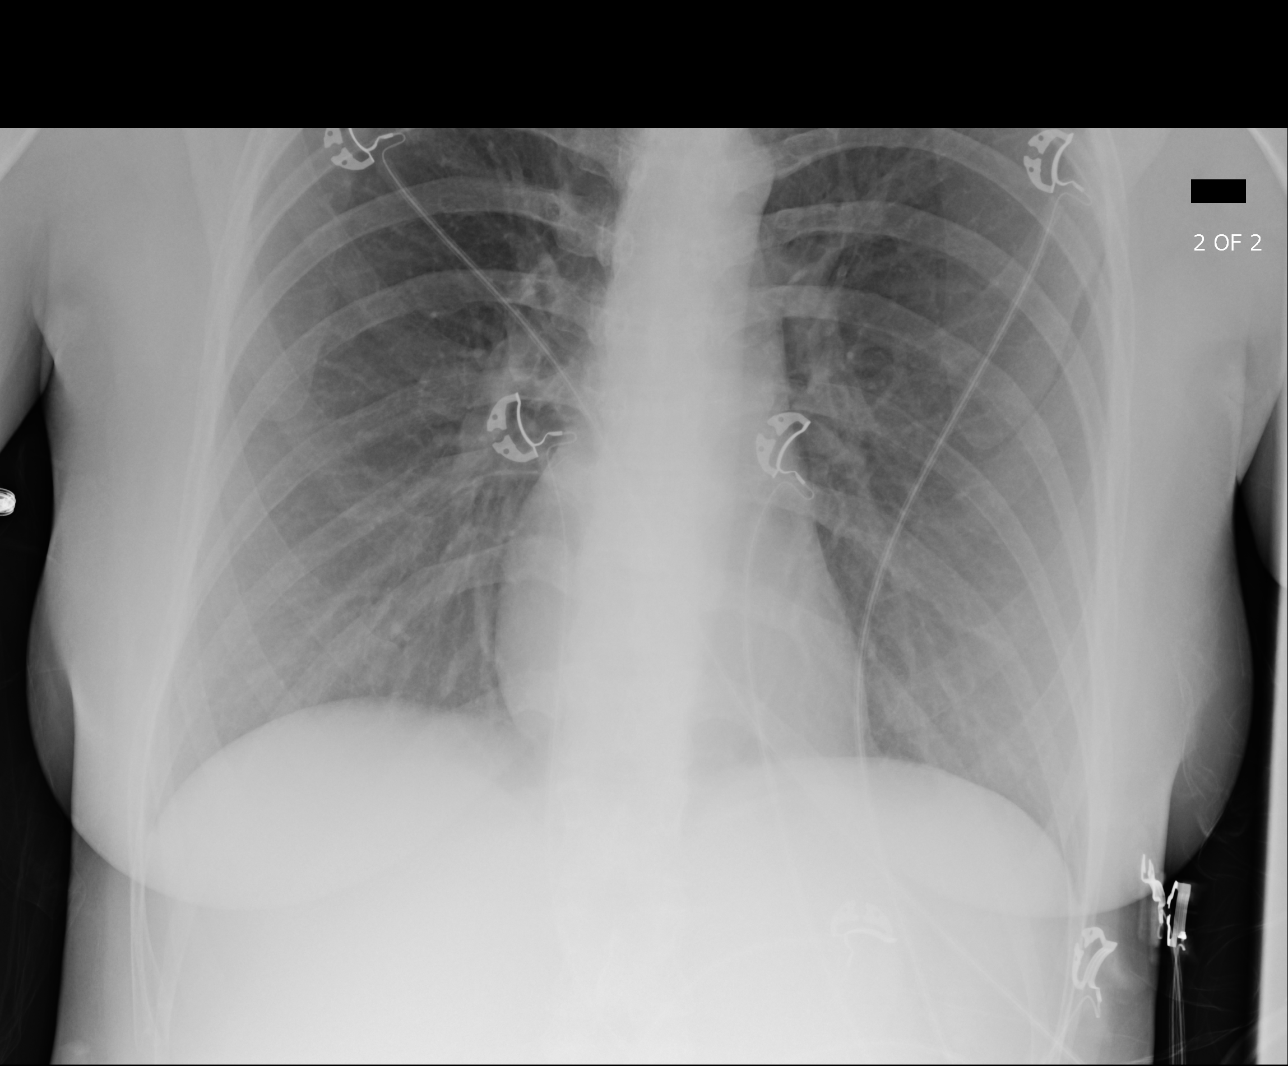
[im 2/2]
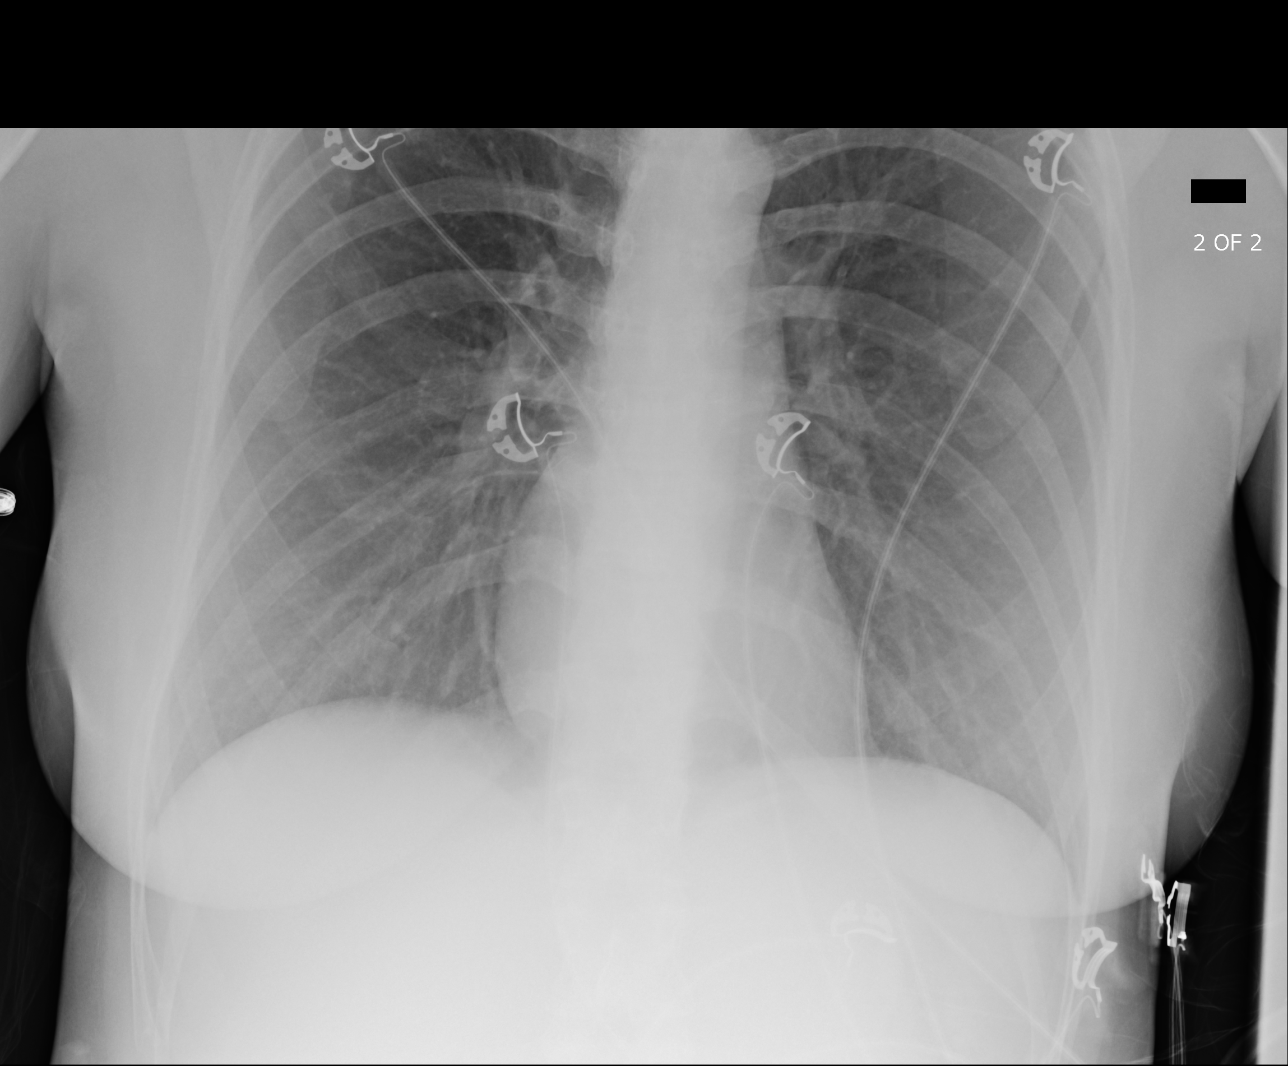

[4 of 4 positions shown; findings below may reference images not displayed]

FINDINGS: The heart size and mediastinal contours are within normal limits.
Both lungs are clear. The visualized skeletal structures are
unremarkable.
IMPRESSION: No active disease.
